# Patient Record
Sex: Male | Born: 1960 | Race: White | Hispanic: No | Marital: Single | Smoking: Former smoker
Health system: Southern US, Community
[De-identification: ages and names within clinical notes are randomized; demographics above are authoritative.]

## PROBLEM LIST (undated history)

## (undated) DIAGNOSIS — D6862 Lupus anticoagulant syndrome: Secondary | ICD-10-CM

## (undated) DIAGNOSIS — Z86718 Personal history of other venous thrombosis and embolism: Secondary | ICD-10-CM

## (undated) DIAGNOSIS — N200 Calculus of kidney: Secondary | ICD-10-CM

## (undated) DIAGNOSIS — G4730 Sleep apnea, unspecified: Secondary | ICD-10-CM

## (undated) DIAGNOSIS — I10 Essential (primary) hypertension: Secondary | ICD-10-CM

## (undated) DIAGNOSIS — J189 Pneumonia, unspecified organism: Secondary | ICD-10-CM

## (undated) DIAGNOSIS — E669 Obesity, unspecified: Secondary | ICD-10-CM

## (undated) DIAGNOSIS — D6851 Activated protein C resistance: Secondary | ICD-10-CM

## (undated) DIAGNOSIS — K439 Ventral hernia without obstruction or gangrene: Secondary | ICD-10-CM

## (undated) HISTORY — DX: Essential (primary) hypertension: I10

## (undated) HISTORY — DX: Activated protein C resistance: D68.51

## (undated) HISTORY — DX: Personal history of other venous thrombosis and embolism: Z86.718

## (undated) HISTORY — DX: Ventral hernia without obstruction or gangrene: K43.9

## (undated) HISTORY — DX: Pneumonia, unspecified organism: J18.9

## (undated) HISTORY — DX: Calculus of kidney: N20.0

## (undated) HISTORY — DX: Lupus anticoagulant syndrome: D68.62

## (undated) HISTORY — DX: Obesity, unspecified: E66.9

## (undated) HISTORY — DX: Sleep apnea, unspecified: G47.30

---
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
## 1983-10-11 HISTORY — PX: FINGER TENDON REPAIR: SHX1640

## 1995-10-11 HISTORY — PX: KIDNEY STONE SURGERY: SHX686

## 2001-03-28 ENCOUNTER — Encounter: Payer: Self-pay | Admitting: Emergency Medicine

## 2001-03-28 ENCOUNTER — Emergency Department (HOSPITAL_COMMUNITY): Admission: EM | Admit: 2001-03-28 | Discharge: 2001-03-28 | Payer: Self-pay | Admitting: Emergency Medicine

## 2001-10-10 HISTORY — PX: OTHER SURGICAL HISTORY: SHX169

## 2001-11-12 ENCOUNTER — Ambulatory Visit (HOSPITAL_COMMUNITY): Admission: RE | Admit: 2001-11-12 | Discharge: 2001-11-12 | Payer: Self-pay

## 2002-10-10 HISTORY — PX: OTHER SURGICAL HISTORY: SHX169

## 2003-10-11 HISTORY — PX: HERNIA REPAIR: SHX51

## 2003-10-11 HISTORY — PX: REVISION TOTAL HIP ARTHROPLASTY: SHX766

## 2004-10-10 HISTORY — PX: OTHER SURGICAL HISTORY: SHX169

## 2015-05-06 ENCOUNTER — Telehealth: Payer: Self-pay | Admitting: Cardiovascular Disease

## 2015-05-06 NOTE — Telephone Encounter (Signed)
Received records from [facility] for appointment on 06/10/15 with Dr Gwenlyn Found.  Records given to [facility] (medical records) for Dr Kennon Holter schedule on 06/10/15. lp

## 2015-06-10 ENCOUNTER — Ambulatory Visit: Payer: Self-pay | Admitting: Cardiovascular Disease

## 2017-11-15 ENCOUNTER — Ambulatory Visit: Payer: BC Managed Care – PPO | Admitting: Gastroenterology

## 2017-11-15 ENCOUNTER — Other Ambulatory Visit: Payer: Self-pay

## 2017-11-20 ENCOUNTER — Encounter: Payer: Self-pay | Admitting: Emergency Medicine

## 2017-11-22 ENCOUNTER — Encounter (INDEPENDENT_AMBULATORY_CARE_PROVIDER_SITE_OTHER): Payer: Self-pay

## 2017-11-22 ENCOUNTER — Encounter: Payer: Self-pay | Admitting: Gastroenterology

## 2017-11-22 ENCOUNTER — Telehealth: Payer: Self-pay | Admitting: Emergency Medicine

## 2017-11-22 ENCOUNTER — Ambulatory Visit: Payer: BC Managed Care – PPO | Admitting: Gastroenterology

## 2017-11-22 VITALS — BP 130/78 | HR 84 | Ht 74.0 in | Wt >= 6400 oz

## 2017-11-22 DIAGNOSIS — Z7901 Long term (current) use of anticoagulants: Secondary | ICD-10-CM | POA: Insufficient documentation

## 2017-11-22 DIAGNOSIS — R195 Other fecal abnormalities: Secondary | ICD-10-CM | POA: Insufficient documentation

## 2017-11-22 MED ORDER — NA SULFATE-K SULFATE-MG SULF 17.5-3.13-1.6 GM/177ML PO SOLN: 1.0000 | ORAL | 0 refills | Status: DC

## 2017-11-22 NOTE — Progress Notes (Signed)
11/22/2017 Cody Turner [id number] [birth year]   HISTORY OF PRESENT ILLNESS:  This is a pleasant 57 year old male who is new to our office.  He was referred here by his PCP, Dr. Helene Kelp, for evaluation regarding a positive cologuard stool study.  We do not have the results but are trying to get them from his PCP.  He has never had a colonoscopy in the past.  He denies seeing blood in his stools or black stools.  He is on coumadin due to history of blood clots caused by Leiden Factor 5 and lupus anticoagulant syndrome.  He has been on this since 2003 and has held it for other things in the past with lovenox in its place.  Hgb is normal.  His BMI is over 50.   Denies any GI complaints.  He is currently getting over shingles so has been in a lot of pain from that.   Past Medical History:  Diagnosis Date  . Hx of blood clots   . Hypertension   . Kidney stones   . Obesity   . Pneumonia   . Sleep apnea   . Ventral hernia    Past Surgical History:  Procedure Laterality Date  . APPENDECTOMY  1975  . FINGER TENDON REPAIR Left 1985  . hematoma removal Right 2006   shin  . HERNIA REPAIR  2005  . ingrown toenail removal  Bilateral 2004  . [place]  . REVISION TOTAL HIP ARTHROPLASTY Left 2005  . sinus biopsy  2003  . staph infection removal  2003   at pin site   . traction rod removal.  2003    reports that he has quit smoking. he has never used smokeless tobacco. He reports that he drinks alcohol. He reports that he does not use drugs. family history includes Breast cancer in his mother; Heart disease in his maternal grandfather, maternal grandmother, and paternal grandfather; Other in his father. Allergies  Allergen Reactions  . Augmentin [Amoxicillin-Pot Clavulanate] Rash      Outpatient Encounter Medications as of 11/22/2017  Medication Sig  . Ascorbic Acid (VITA-C PO) Take 1 capsule by mouth daily.  . clindamycin (CLEOCIN) 150 MG capsule Take 300 mg by  mouth 3 (three) times daily.  Marland Kitchen gabapentin (NEURONTIN) 300 MG capsule Take 300 mg by mouth 3 (three) times daily.   . hydrochlorothiazide (HYDRODIURIL) 25 MG tablet Take 25 mg by mouth daily.   Marland Kitchen losartan (COZAAR) 100 MG tablet Take 100 mg by mouth daily.   . metoprolol succinate (TOPROL-XL) 100 MG 24 hr tablet Take 100 mg by mouth daily.   . MULTIPLE VITAMIN PO Take 1 capsule by mouth daily.  . traMADol (ULTRAM) 50 MG tablet Take 50 mg by mouth 3 (three) times daily.   Marland Kitchen warfarin (COUMADIN) 1 MG tablet Takes 7 mg mon and Friday and 10mg  other days  . [DISCONTINUED] clindamycin (CLEOCIN) 300 MG capsule   . [DISCONTINUED] valACYclovir (VALTREX) 1000 MG tablet    No facility-administered encounter medications on file as of 11/22/2017.      REVIEW OF SYSTEMS  : All other systems reviewed and negative except where noted in the History of Present Illness.   PHYSICAL EXAM: BP 130/78 (Cuff Size: Large)   Pulse 84   Ht 6\' 2"  (1.88 m)   Wt (!) 447 lb (202.8 kg)   BMI 57.39 kg/m  General: Well developed white male in no acute distress Head: Normocephalic and  atraumatic Eyes:  Sclerae anicteric, conjunctiva pink. Ears: Normal auditory acuity Lungs: Clear throughout to auscultation; no increased WOB. Heart: Regular rate and rhythm; no M/R/G. Abdomen: Soft, obese, non-distended.  BS present. Non-tender. Rectal:  Will be done at the time of colonoscopy. Musculoskeletal: Symmetrical with no gross deformities  Skin: No lesions on visible extremities Extremities: No edema  Neurological: Alert oriented x 4, grossly non-focal Psychological:  Alert and cooperative. Normal mood and affect  ASSESSMENT AND PLAN: *Positive cologuard/abnormal contents in stool:  Needs colonoscopy.  Will schedule with Dr. Loletha Carrow at [facility] hospital due to BMI over 50. *Leiden Factor 5 and lupus anticoagulant syndrome, requiring anticoagulation with coumadin:  Will hold coumadin for 5 days prior to endoscopic procedures -  will instruct when and how to resume after procedure. Benefits and risks of procedure explained including risks of bleeding, perforation, infection, missed lesions, reactions to medications and possible need for hospitalization and surgery for complications. Additional rare but real risk of stroke or other vascular clotting events off of coumadin also explained and need to seek urgent help if any signs of these problems occur. Will communicate by phone or EMR with patient's  prescribing provider, Dr. Helene Kelp, to confirm that holding coumadin is reasonable in this case.  Will likely need Lovenox, which will defer to his PCP.   CC:  Ronita Hipps, MD

## 2017-11-22 NOTE — Telephone Encounter (Signed)
[name] 1961-09-18 [id number]  Dear Dr. Helene Kelp:  We have scheduled the above named patient for a colonoscopy procedure. Our records show that he is on anticoagulation therapy.  Please advise as to whether the patient may come off their therapy of Coumadin 7 days prior to their procedure which is scheduled for 01-16-18 and if he will need Lovenox bridging.  Please route your response to Tinnie Gens, CMA or fax response to [phone].  Sincerely,    [facility]

## 2017-11-22 NOTE — Patient Instructions (Signed)

## 2017-11-23 NOTE — Progress Notes (Signed)
Thank you for sending this case to me. I have reviewed the entire note, and the outlined plan seems appropriate.  Agree with colonoscopy at hospital endoscopy lab due to BMI.  My next available is April, but it appears this can wait until then.  Must have assistance from his primary care provider re: bridging lovenox therapy if needed.  Wilfrid Lund, MD

## 2017-11-27 NOTE — Telephone Encounter (Signed)
Received fax from Dr. Greggory Keen office ok to hold Coumadin 7 days prior to patients appt. Does need to be bridged the entire time and 2 days after starting Coumadin back. He had an appointment on 11-24-17 and was informed of this at his appointment. Will send fax to be scanned.

## 2018-01-08 ENCOUNTER — Ambulatory Visit: Payer: BC Managed Care – PPO | Admitting: Internal Medicine

## 2018-01-08 ENCOUNTER — Ambulatory Visit (INDEPENDENT_AMBULATORY_CARE_PROVIDER_SITE_OTHER)
Admission: RE | Admit: 2018-01-08 | Discharge: 2018-01-08 | Disposition: A | Discharge status: Home / Self Care | Payer: BC Managed Care – PPO | Source: Ambulatory Visit | Attending: Internal Medicine | Admitting: Internal Medicine

## 2018-01-08 ENCOUNTER — Encounter: Payer: Self-pay | Admitting: Internal Medicine

## 2018-01-08 VITALS — BP 122/80 | HR 71 | Ht 74.0 in | Wt >= 6400 oz

## 2018-01-08 DIAGNOSIS — R05 Cough: Secondary | ICD-10-CM | POA: Diagnosis not present

## 2018-01-08 DIAGNOSIS — R1012 Left upper quadrant pain: Secondary | ICD-10-CM

## 2018-01-08 DIAGNOSIS — R058 Other specified cough: Secondary | ICD-10-CM

## 2018-01-08 MED ORDER — PREDNISONE 10 MG PO TABS: ORAL_TABLET | ORAL | 0 refills | Status: DC

## 2018-01-08 MED ORDER — ACETAMINOPHEN-CODEINE #3 300-30 MG PO TABS: 1.0000 | ORAL_TABLET | ORAL | 0 refills | Status: AC | PRN
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
## 2018-01-08 NOTE — Patient Instructions (Addendum)
Take mucinex dm 1200 every 12 hours and supplement if needed with tylenol #3  up to 1-2 every 4 hours to suppress the urge to cough. Swallowing water and/or using ice chips/non mint and menthol containing candies (such as lifesavers or sugarless jolly ranchers) are also effective.  You should rest your voice and avoid activities that you know make you cough.  Once you have eliminated the cough for 3 straight days try reducing the tylenol #3 first,  then the mucinex dm  as tolerated.   Prednisone 10 mg take  4 each am x 2 days,   2 each am x 2 days,  1 each am x 2 days and stop    Try prilosec otc 20mg   Take 30-60 min before first meal of the day and Pepcid ac - also otc- (famotidine) 20 mg one @  bedtime until cough is completely gone for at least a week without the need for cough suppression     GERD (REFLUX)  is an extremely common cause of respiratory symptoms just like yours , many times with no obvious heartburn at all.    It can be treated with medication, but also with lifestyle changes including elevation of the head of your bed (ideally with 6 inch  bed blocks),  Smoking cessation, avoidance of late meals, excessive alcohol, and avoid fatty foods, chocolate, peppermint, colas, red wine, and acidic juices such as orange juice.  NO MINT OR MENTHOL PRODUCTS SO NO COUGH DROPS   USE SUGARLESS CANDY INSTEAD (Jolley ranchers or Stover's or Life Savers) or even ice chips will also do - the key is to swallow to prevent all throat clearing. NO OIL BASED VITAMINS - use powdered substitutes.    Please remember to go to the  x-ray department downstairs in the basement  for your tests - we will call you with the results when they are available.      If not better in a week please return to clinic

## 2018-01-09 ENCOUNTER — Telehealth: Payer: Self-pay | Admitting: *Deleted

## 2018-01-09 ENCOUNTER — Telehealth: Payer: Self-pay | Admitting: Gastroenterology

## 2018-01-09 ENCOUNTER — Encounter: Payer: Self-pay | Admitting: Internal Medicine

## 2018-01-09 DIAGNOSIS — R1012 Left upper quadrant pain: Secondary | ICD-10-CM | POA: Insufficient documentation

## 2018-01-09 NOTE — Telephone Encounter (Signed)
Pt is calling back [phone]

## 2018-01-09 NOTE — Telephone Encounter (Signed)
Please advise 

## 2018-01-09 NOTE — Telephone Encounter (Signed)
LMTCB

## 2018-01-09 NOTE — Assessment & Plan Note (Signed)
Body mass index is 56.13 kg/m.  -  trending donw No results found for: TSH   Contributing to gerd risk/ doe/needs  to achieve and maintain neg calorie balance long term > defer f/u primary care including intermittently monitoring thyroid status

## 2018-01-09 NOTE — Telephone Encounter (Signed)
Called and spoke with patient, he states that he is feeling much better and has no skin discoloration or bruising. Routing this to MW for FYI.

## 2018-01-09 NOTE — Progress Notes (Signed)
LMTCB

## 2018-01-09 NOTE — Telephone Encounter (Signed)
-----   Message from Tanda Rockers, MD sent at 01/09/2018  6:15 AM EDT ----- Call and see if better and also ask him to look for any signs of bruising /skin discoloration in the area he's hurting and if so may need to hold coumadin, check inr/ get CT abd to look for hematoma

## 2018-01-09 NOTE — Assessment & Plan Note (Addendum)
Of the three most common causes of  Sub-acute or recurrent or chronic cough, only one (GERD)  can actually contribute to/ trigger  the other two (asthma and post nasal drip syndrome)  and perpetuate the cylce of cough.  While not intuitively obvious, many patients with chronic low grade reflux do not cough until there is a primary insult that disturbs the protective epithelial barrier and exposes sensitive nerve endings.   This is typically viral as likely was the case here.     The point is that once this occurs, it is difficult to eliminate the cycle  using anything but a maximally effective acid suppression regimen at least in the short run, accompanied by an appropriate diet to address non acid GERD and control / eliminate the cough itself for at least 3 days with tylenol #3 and if not better sinus CT next step   Total time devoted to counseling  > 50 % of initial 60 min office visit:  review case with pt/ discussion of options/alternatives/ personally creating written customized instructions  in presence of pt  then going over those specific  Instructions directly with the pt including how to use all of the meds but in particular covering each new medication in detail and the difference between the maintenance= "automatic" meds and the prns using an action plan format for the latter (If this problem/symptom => do that organization reading Left to right).  Please see AVS from this visit for a full list of these instructions which I personally wrote for this pt and  are unique to this visit.

## 2018-01-10 NOTE — Telephone Encounter (Signed)
Thank you for telling me. It must be moved to my next available [facility] outpatient date, and he will need new instructions for bowel preparation and anticoagulation.  Please hold that slot for next week because I may need it for someone else.  - HD

## 2018-01-12 ENCOUNTER — Other Ambulatory Visit: Payer: Self-pay

## 2018-01-12 NOTE — Telephone Encounter (Signed)
Patient advised of rescheduled date, mailed new instructions to him. Mailed patient reminder on holding coumadin 7 days prior to procedure, he verbalized understanding of instructions on doing the lovenox bridge. He states he received instructions from his PCP on the lovenox.

## 2018-02-05 ENCOUNTER — Telehealth: Payer: Self-pay | Admitting: Internal Medicine

## 2018-02-05 ENCOUNTER — Telehealth: Payer: Self-pay

## 2018-02-05 ENCOUNTER — Ambulatory Visit: Payer: BC Managed Care – PPO | Admitting: Internal Medicine

## 2018-02-05 VITALS — BP 136/80 | HR 79 | Ht 74.0 in | Wt >= 6400 oz

## 2018-02-05 DIAGNOSIS — R05 Cough: Secondary | ICD-10-CM

## 2018-02-05 DIAGNOSIS — R1012 Left upper quadrant pain: Secondary | ICD-10-CM | POA: Diagnosis not present

## 2018-02-05 DIAGNOSIS — I10 Essential (primary) hypertension: Secondary | ICD-10-CM | POA: Insufficient documentation

## 2018-02-05 DIAGNOSIS — R058 Other specified cough: Secondary | ICD-10-CM

## 2018-02-05 MED ORDER — BUDESONIDE-FORMOTEROL FUMARATE 80-4.5 MCG/ACT IN AERO: INHALATION_SPRAY | RESPIRATORY_TRACT | 11 refills | Status: DC

## 2018-02-05 MED ORDER — ACETAMINOPHEN-CODEINE #3 300-30 MG PO TABS: 1.0000 | ORAL_TABLET | ORAL | 0 refills | Status: AC | PRN

## 2018-02-05 MED ORDER — PANTOPRAZOLE SODIUM 40 MG PO TBEC: DELAYED_RELEASE_TABLET | ORAL | 2 refills | Status: DC
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
## 2018-02-05 NOTE — Assessment & Plan Note (Signed)
Of the three most common causes of  Sub-acute / recurrent or chronic cough, only one (GERD)  can actually contribute to/ trigger  the other two (asthma and post nasal drip syndrome)  and perpetuate the cylce of cough.  While not intuitively obvious, many patients with chronic low grade reflux do not cough until there is a primary insult that disturbs the protective epithelial barrier and exposes sensitive nerve endings.   This is typically viral but can due to PNDS from chronic rhintis/sinusitis  And  may apply here.    The point is that once this occurs, it is difficult to eliminate the cycle  using anything but a maximally effective acid suppression regimen at least in the short run, accompanied by an appropriate diet to address non acid GERD and control / eliminate the cough itself for at least 3 days with codeine.   rec max acid suppression with pppi bid ac and h2 hs and refer back to ENT for probable chronic sinusitis/ pnds   I had an extended discussion with the patient reviewing all relevant studies completed to date and  lasting 15 to 20 minutes of a 25 minute acute office visit    Each maintenance medication was reviewed in detail including most importantly the difference between maintenance and prns and under what circumstances the prns are to be triggered using an action plan format that is not reflected in the computer generated alphabetically organized AVS.    Please see AVS for specific instructions unique to this visit that I personally wrote and verbalized to the the pt in detail and then reviewed with pt  by my nurse highlighting any  changes in therapy recommended at today's visit to their plan of care.

## 2018-02-05 NOTE — Assessment & Plan Note (Signed)
Body mass index is 57.06 kg/m.  -  trending up  No results found for: TSH   Contributing to gerd risk/ doe/reviewed the need and the process to achieve and maintain neg calorie balance > defer f/u primary care including intermittently monitoring thyroid status

## 2018-02-05 NOTE — Telephone Encounter (Signed)
Called and spoke to pt. Informed him of the recs per MW. Appt made with MW at 430 today. Pt aware to bring all meds with him. Nothing further needed at this time.

## 2018-02-05 NOTE — Patient Instructions (Addendum)
Take mucinex dm 1200 every 12 hours and supplement if needed with tylenol #3  up to 1-2 every 4 hours to suppress the urge to cough. Swallowing water and/or using ice chips/non mint and menthol containing candies (such as lifesavers or sugarless jolly ranchers) are also effective.  You should rest your voice and avoid activities that you know make you cough.  Once you have eliminated the cough for 3 straight days try reducing the tylenol #3 first,  then the mucinex dm  as tolerated.    Start Protonix 40 mg Take 30- 60 min before your first and last meals of the day   And Pepcid ac - also otc- (famotidine) 20 mg one @  bedtime until you return here   Let Dr Meredith Leeds know I think he should proceed with further sinus evaluation based on your nasal exam today which looks suggests active infection   For breakthu  Heartburn you can use gaviscon liquid as needed  Please schedule a follow up office visit in 2 weeks, sooner if needed  with all medications /inhalers/ solutions in hand so we can verify exactly what you are taking. This includes all medications from all doctors and over the counters

## 2018-02-05 NOTE — Assessment & Plan Note (Signed)
Concerned about high dose BB use here an ideally In the setting of respiratory symptoms of unknown etiology,  It would be preferable to use bystolic, the most beta -1  selective Beta blocker available in sample form, with bisoprolol the most selective generic choice  on the market, at least on a trial basis, to make sure the spillover Beta 2 effects of the less specific Beta blockers are not contributing to this patient's symptoms.   For now will ask him to just change the lopressor to 100 mg one half bid to lower potential of peak levels to contribute to airway instability/ cough
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
## 2018-02-05 NOTE — Telephone Encounter (Signed)
Called and spoke with patients mother. She stated that she was calling for the patient since he was not feeling well.   Called and spoke with patient, he states that he is still having congestion, deep coughing, and increased shortness of breath. Patient states that he has a colonoscopy on 5.6.19 and would like this cleared up before then.   Patient saw MW on 4.1.19 here are the last OV instructions. MW please advise. Thanks.   ------------------------------- Take mucinex dm 1200 every 12 hours and supplement if needed with tylenol #3  up to 1-2 every 4 hours to suppress the urge to cough. Swallowing water and/or using ice chips/non mint and menthol containing candies (such as lifesavers or sugarless jolly ranchers) are also effective.  You should rest your voice and avoid activities that you know make you cough.  Once you have eliminated the cough for 3 straight days try reducing the tylenol #3 first,  then the mucinex dm  as tolerated.   Prednisone 10 mg take  4 each am x 2 days,   2 each am x 2 days,  1 each am x 2 days and stop    Try prilosec otc 20mg   Take 30-60 min before first meal of the day and Pepcid ac - also otc- (famotidine) 20 mg one @  bedtime until cough is completely gone for at least a week without the need for cough suppression     GERD (REFLUX)  is an extremely common cause of respiratory symptoms just like yours , many times with no obvious heartburn at all.    It can be treated with medication, but also with lifestyle changes including elevation of the head of your bed (ideally with 6 inch  bed blocks),  Smoking cessation, avoidance of late meals, excessive alcohol, and avoid fatty foods, chocolate, peppermint, colas, red wine, and acidic juices such as orange juice.  NO MINT OR MENTHOL PRODUCTS SO NO COUGH DROPS   USE SUGARLESS CANDY INSTEAD (Jolley ranchers or Stover's or Life Savers) or even ice chips will also do - the key is to swallow to prevent all throat  clearing. NO OIL BASED VITAMINS - use powdered substitutes.    Please remember to go to the  x-ray department downstairs in the basement  for your tests - we will call you with the results when they are available.      If not better in a week please return to clinic

## 2018-02-05 NOTE — Telephone Encounter (Signed)
Ov with all meds in hand ok to add on at end of days

## 2018-02-05 NOTE — Assessment & Plan Note (Signed)
Onset 01/07/18 > rx Tylenol #3 and   ask him to look for any signs of bruising /skin discoloration in the area he's hurting and if so may need to hold coumadin, check inr/ get CT abd to look for hematoma > resolved 02/05/2018 despite continued cough

## 2018-02-06 ENCOUNTER — Encounter: Payer: Self-pay | Admitting: Cardiology

## 2018-02-07 ENCOUNTER — Ambulatory Visit: Payer: BC Managed Care – PPO | Admitting: Cardiovascular Disease

## 2018-02-07 ENCOUNTER — Encounter: Payer: Self-pay | Admitting: Cardiovascular Disease

## 2018-02-07 VITALS — BP 126/88 | HR 78 | Ht 74.0 in | Wt >= 6400 oz

## 2018-02-07 DIAGNOSIS — R0609 Other forms of dyspnea: Secondary | ICD-10-CM | POA: Diagnosis not present

## 2018-02-07 NOTE — Patient Instructions (Addendum)
  Office: [phone]  /  Fax: [phone]  Medication Instructions:  Your physician recommends that you continue on your current medications as directed. Please refer to the Current Medication list given to you today.   Labwork: None Ordered   Testing/Procedures: None Ordered   Follow-Up: Your physician recommends that you schedule a follow-up appointment in: as needed with  Dr. Acie Fredrickson   If you need a refill on your cardiac medications before your next appointment, please call your pharmacy.   Thank you for choosing [facility]! Christen Bame, RN [phone]

## 2018-02-07 NOTE — Progress Notes (Signed)
Cardiology Office Note:    Date:  02/07/2018   ID:  Cody Turner, DOB 05-26-1961, MRN [id number]  PCP:  Ronita Hipps, MD  Cardiologist:  Cardiology   Referring MD: Ronita Hipps, MD   Chief Complaint  Patient presents with  . Shortness of Breath     Feb 07, 2018    Cody Turner is a 57 y.o. male with a hx of  morbid obesity, obstructive sleep apnea , hypertension we were asked to see today by Dr. Melvyn Novas  for further evaluation of chest pain and shortness of breath.  He is on chronic Coumadin therapy because of a hypercoagulable state.    . Lupus anticoagulant syndrome (HCC)   Has had chronic shortness of breath Had some chest pain - related to a shingles infection Had some indigestion , chest burning  Better with prilosec Saw pulmonary the next day who agreed that the issues was likely GERD   Scheduled for colonoscopy next week.  He is planning on having bariatric surgery later this summer.  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    Walks in the pool.   No CP  With exercise  has significant DOE  Has OSA - is on BIPAP   Has a hypercoagulable state - Factor V Leiden and Lupus anticoagulant are both listed.   He does not know which he has.   Past Medical History:  Diagnosis Date  . Factor V Leiden ([place])   . Hx of blood clots   . Hypertension   . Kidney stones   . Lupus anticoagulant syndrome ([place])   . Obesity   . Pneumonia   . Sleep apnea   . Ventral hernia     Past Surgical History:  Procedure Laterality Date  . APPENDECTOMY  1975  . FINGER TENDON REPAIR Left 1985  . hematoma removal Right 2006   shin  . HERNIA REPAIR  2005  . ingrown toenail removal  Bilateral 2004  . [place]  . REVISION TOTAL HIP ARTHROPLASTY Left 2005  . sinus biopsy  2003  . staph infection removal  2003   at pin site   . traction rod removal.  2003    Current Medications: Current Meds  Medication Sig    . acetaminophen-codeine (TYLENOL #3) 300-30 MG tablet Take 1-2 tablets by mouth every 4 (four) hours as needed for up to 5 days (cough).  . Ascorbic Acid (VITA-C PO) Take 1 capsule by mouth daily.  Marland Kitchen aspirin 81 MG EC tablet Take 81 mg by mouth daily. Swallow whole.  . calcium-vitamin D 250-100 MG-UNIT tablet Take 1 tablet by mouth 2 (two) times daily.  . [place] Calcium 500 MG CAPS Take 1 capsule by mouth daily.  Marland Kitchen DOXYCYCLINE PO Take 100 mg by mouth 2 (two) times daily.  . famotidine (PEPCID) 20 MG tablet Take 20 mg by mouth at bedtime.  . hydrochlorothiazide (HYDRODIURIL) 25 MG tablet Take 25 mg by mouth daily.   Marland Kitchen losartan (COZAAR) 100 MG tablet Take 100 mg by mouth daily.   . metoprolol succinate (TOPROL-XL) 100 MG 24 hr tablet Take 100 mg by mouth daily.   . MULTIPLE VITAMIN PO Take 1 capsule by mouth daily.  . Omega-3 Fatty Acids (FISH OIL) 1000 MG CAPS Take 1 capsule by mouth daily.  . pantoprazole (PROTONIX) 40 MG tablet Take 30- 60 min before your first and last meals of  the day  . UNABLE TO FIND Med Name: BIPAP  . warfarin (COUMADIN) 1 MG tablet Takes 7 mg mon and Friday and 10mg  other days  . Zinc 100 MG TABS Take by mouth daily.     Allergies:   Augmentin [amoxicillin-pot clavulanate]   Social History   Socioeconomic History  . Marital status: Single    Spouse name: Not on file  . Number of children: 0  . Years of education: Not on file  . Highest education level: Not on file  Occupational History  . Occupation: Health and safety inspector    Comment: disabled  Social Needs  . Financial resource strain: Not on file  . Food insecurity:    Worry: Not on file    Inability: Not on file  . Transportation needs:    Medical: Not on file    Non-medical: Not on file  Tobacco Use  . Smoking status: Former Smoker    Packs/day: 2.00    Years: 5.00    Pack years: 10.00    Types: Cigarettes    Last attempt to quit: 10/10/2001    Years since quitting: 16.3  . Smokeless tobacco: Never Used   Substance and Sexual Activity  . Alcohol use: Yes    Frequency: Never    Comment: occ beer  . Drug use: No  . Sexual activity: Not on file  Lifestyle  . Physical activity:    Days per week: Not on file    Minutes per session: Not on file  . Stress: Not on file  Relationships  . Social connections:    Talks on phone: Not on file    Gets together: Not on file    Attends religious service: Not on file    Active member of club or organization: Not on file    Attends meetings of clubs or organizations: Not on file    Relationship status: Not on file  Other Topics Concern  . Not on file  Social History Narrative  . Not on file     Family History: The patient's family history includes Breast cancer in his mother; Heart disease in his maternal grandfather, maternal grandmother, and paternal grandfather; Other in his father. There is no history of Colon cancer, Esophageal cancer, or Rectal cancer.  ROS:   Please see the history of present illness.     All other systems reviewed and are negative.  EKGs/Labs/Other Studies Reviewed:    The following studies were reviewed today:   EKG:    Feb 07, 2018:   NSR at 29. Normal ECG   Recent Labs: No results found for requested labs within last 8760 hours.  Recent Lipid Panel No results found for: CHOL, TRIG, HDL, CHOLHDL, VLDL, LDLCALC, LDLDIRECT  Physical Exam:    VS:  BP 126/88   Pulse 78   Ht 6\' 2"  (1.88 m)   Wt (!) 444 lb 12.8 oz (201.8 kg)   SpO2 93%   BMI 57.11 kg/m     Wt Readings from Last 3 Encounters:  02/07/18 (!) 444 lb 12.8 oz (201.8 kg)  02/05/18 (!) 444 lb 6.4 oz (201.6 kg)  01/08/18 (!) 437 lb 3.2 oz (198.3 kg)     GEN: morbidly obese male HEENT: Normal NECK: No JVD; No carotid bruits LYMPHATICS: No lymphadenopathy CARDIAC: RRR, no murmurs, rubs, gallops RESPIRATORY:  Clear to auscultation without rales, wheezing or rhonchi  ABDOMEN: Soft, non-tender, non-distended MUSCULOSKELETAL:  No edema; No  deformity  SKIN: Warm and dry NEUROLOGIC:  Alert and oriented x 3 PSYCHIATRIC:  Normal affect   ASSESSMENT:    1. DOE (dyspnea on exertion)   2. Morbid obesity ([place])    PLAN:    In order of problems listed above:  1. Chest discomfort: The patient had some chest discomfort several months ago.  It turns out that this was related to a shingles infection.  Is not had any recurrent episodes of chest pain since that time.  Also had some indigestion.  The symptoms resolved with Prilosec.  Shortness of breath with exertion: I suspect that his shortness of breath is due to morbid obesity.  He has pulmonary hypertension.  He is seeing pulmonary medicine.  Of given him contact information for  Office: [phone]  /  Fax: [phone]      we have ash him to have his medical doctor make the referral    No active cardiac issues ,   Return as needed.    Medication Adjustments/Labs and Tests Ordered: Current medicines are reviewed at length with the patient today.  Concerns regarding medicines are outlined above.  Orders Placed This Encounter  Procedures  . EKG 12-Lead   No orders of the defined types were placed in this encounter.   Patient Instructions   Office: [phone]  /  Fax: [phone]  Medication Instructions:  Your physician recommends that you continue on your current medications as directed. Please refer to the Current Medication list given to you today.   Labwork: None Ordered   Testing/Procedures: None Ordered   Follow-Up: Your physician recommends that you schedule a follow-up appointment in: as needed with  Dr. Acie Fredrickson   If you need a refill on your cardiac medications before your next appointment, please call your pharmacy.   Thank you for choosing [facility]! Christen Bame, RN [phone]       Signed, Mertie Moores, MD  02/07/2018 5:36 PM    [place]

## 2018-02-08 ENCOUNTER — Telehealth: Payer: Self-pay | Admitting: Gastroenterology

## 2018-02-08 NOTE — Telephone Encounter (Signed)
Thank you for checking.  He is OK to have procedure as schedule on 02/12/18  Last dose of coumadin 5/1 is still 5 days before procedure date.

## 2018-02-08 NOTE — Telephone Encounter (Signed)
Patient advised ok to proceed as scheduled for colonoscopy on 02/12/18.

## 2018-02-08 NOTE — Telephone Encounter (Signed)
Spoke to patient, he had looked at the wrong packet information about his coumadin. Had also verbally went over instructions with him on phone on 01/09/18. He mistakenly took his last dose of coumadin on 02/07/18, did have a lovenox injection today. He did call his PCP's office who had advised to stop the coumadin 5 days prior with lovenox BID. He had labs done Tuesday, based on that PCP felt he would be okay. Please advise if patient needs to be rescheduled. He was rescheduled from April due to congestion and Dr. Gustavus Bryant recommendations. Patient is scheduled at hospital for 02/12/18.

## 2018-02-09 ENCOUNTER — Encounter (HOSPITAL_COMMUNITY): Payer: Self-pay

## 2018-02-09 ENCOUNTER — Telehealth: Payer: Self-pay

## 2018-02-09 ENCOUNTER — Other Ambulatory Visit: Payer: Self-pay

## 2018-02-09 NOTE — Telephone Encounter (Signed)
Patient wanted to know when he should take his last dose of lovenox. I let him know that he needs to contact his PCP office immediately to speak to them about this as they are the ones that prescribed it and gave him the directions. He will call them now.

## 2018-02-12 ENCOUNTER — Telehealth: Payer: Self-pay | Admitting: Gastroenterology

## 2018-02-12 ENCOUNTER — Encounter (HOSPITAL_COMMUNITY): Payer: Self-pay

## 2018-02-12 ENCOUNTER — Encounter (HOSPITAL_COMMUNITY)
Admission: RE | Disposition: A | Discharge status: Home / Self Care | Payer: Self-pay | Source: Ambulatory Visit | Attending: Gastroenterology

## 2018-02-12 ENCOUNTER — Other Ambulatory Visit: Payer: Self-pay

## 2018-02-12 ENCOUNTER — Ambulatory Visit (HOSPITAL_COMMUNITY): Payer: BC Managed Care – PPO | Admitting: Anesthesiology

## 2018-02-12 ENCOUNTER — Ambulatory Visit (HOSPITAL_COMMUNITY)
Admission: RE | Admit: 2018-02-12 | Discharge: 2018-02-12 | Disposition: A | Discharge status: Home / Self Care | Payer: BC Managed Care – PPO | Source: Ambulatory Visit | Attending: Gastroenterology | Admitting: Gastroenterology

## 2018-02-12 DIAGNOSIS — K621 Rectal polyp: Secondary | ICD-10-CM | POA: Insufficient documentation

## 2018-02-12 DIAGNOSIS — Q438 Other specified congenital malformations of intestine: Secondary | ICD-10-CM | POA: Diagnosis not present

## 2018-02-12 DIAGNOSIS — Z7901 Long term (current) use of anticoagulants: Secondary | ICD-10-CM

## 2018-02-12 DIAGNOSIS — Z6841 Body Mass Index (BMI) 40.0 and over, adult: Secondary | ICD-10-CM | POA: Diagnosis not present

## 2018-02-12 DIAGNOSIS — I10 Essential (primary) hypertension: Secondary | ICD-10-CM | POA: Diagnosis not present

## 2018-02-12 DIAGNOSIS — K635 Polyp of colon: Secondary | ICD-10-CM | POA: Insufficient documentation

## 2018-02-12 DIAGNOSIS — R195 Other fecal abnormalities: Secondary | ICD-10-CM

## 2018-02-12 DIAGNOSIS — G4730 Sleep apnea, unspecified: Secondary | ICD-10-CM | POA: Insufficient documentation

## 2018-02-12 DIAGNOSIS — Z87891 Personal history of nicotine dependence: Secondary | ICD-10-CM | POA: Insufficient documentation

## 2018-02-12 DIAGNOSIS — Z538 Procedure and treatment not carried out for other reasons: Secondary | ICD-10-CM

## 2018-02-12 DIAGNOSIS — Z79899 Other long term (current) drug therapy: Secondary | ICD-10-CM | POA: Insufficient documentation

## 2018-02-12 DIAGNOSIS — D122 Benign neoplasm of ascending colon: Secondary | ICD-10-CM

## 2018-02-12 DIAGNOSIS — D6851 Activated protein C resistance: Secondary | ICD-10-CM | POA: Diagnosis not present

## 2018-02-12 HISTORY — PX: COLONOSCOPY WITH PROPOFOL: SHX5780

## 2018-02-12 SURGERY — COLONOSCOPY WITH PROPOFOL
Anesthesia: Monitor Anesthesia Care

## 2018-02-12 MED ORDER — PROPOFOL 10 MG/ML IV BOLUS
INTRAVENOUS | Status: AC
  Filled 2018-02-12: qty 20

## 2018-02-12 MED ORDER — PROPOFOL 500 MG/50ML IV EMUL
INTRAVENOUS | Status: DC | PRN
  Administered 2018-02-12: 200 ug/kg/min via INTRAVENOUS

## 2018-02-12 MED ORDER — SODIUM CHLORIDE 0.9 % IV SOLN: INTRAVENOUS | Status: DC

## 2018-02-12 MED ORDER — LIDOCAINE 2% (20 MG/ML) 5 ML SYRINGE
INTRAMUSCULAR | Status: DC | PRN
  Administered 2018-02-12: 40 mg via INTRAVENOUS

## 2018-02-12 MED ORDER — PROPOFOL 10 MG/ML IV BOLUS
INTRAVENOUS | Status: DC | PRN
  Administered 2018-02-12: 60 mg via INTRAVENOUS

## 2018-02-12 MED ORDER — LACTATED RINGERS IV SOLN
INTRAVENOUS | Status: DC
  Administered 2018-02-12: 11:00:00 via INTRAVENOUS

## 2018-02-12 MED ORDER — ONDANSETRON HCL 4 MG/2ML IJ SOLN
INTRAMUSCULAR | Status: DC | PRN
  Administered 2018-02-12: 4 mg via INTRAVENOUS

## 2018-02-12 SURGICAL SUPPLY — 21 items

## 2018-02-12 NOTE — H&P (Signed)
History:  This patient presents for endoscopic testing for positive cologuard test.  Cody Turner Referring physician: Ronita Hipps, MD  Past Medical History: Past Medical History:  Diagnosis Date  . Factor V Leiden ([place])   . Hx of blood clots   . Hypertension   . Kidney stones   . Lupus anticoagulant syndrome ([place])   . Obesity   . Pneumonia   . Sleep apnea    wear bipap  . Ventral hernia      Past Surgical History: Past Surgical History:  Procedure Laterality Date  . APPENDECTOMY  1975  . FINGER TENDON REPAIR Left 1985  . hematoma removal Right 2006   shin  . HERNIA REPAIR  2005  . ingrown toenail removal  Bilateral 2004  . [place]  . REVISION TOTAL HIP ARTHROPLASTY Left 2005  . sinus biopsy  2003  . staph infection removal  2003   at pin site   . traction rod removal.  2003    Allergies: Allergies  Allergen Reactions  . Augmentin [Amoxicillin-Pot Clavulanate] Rash    Outpatient Meds: Current Facility-Administered Medications  Medication Dose Route Frequency Provider Last Rate Last Dose  . 0.9 %  sodium chloride infusion   Intravenous Continuous Cody Turner, Cody D, PA-C          ___________________________________________________________________ Objective   Exam:  BP (!) 155/63   Pulse 63   Temp 97.9 F (36.6 C) (Oral)   Resp 15   Ht 6\' 2"  (1.88 m)   Wt (!) 444 lb (201.4 kg)   SpO2 96%   BMI 57.01 kg/m    CV: RRR without murmur, S1/S2, no JVD, no peripheral edema  Resp: clear to auscultation bilaterally, normal RR and effort noted  GI: morbidly obese, limiting exam, soft, no tenderness, with active bowel sounds. No guarding or palpable organomegaly noted.  Neuro: awake, alert and oriented x 3. Normal gross motor function and fluent speech   Assessment:  Positive cologuard  Through a miscommunication, this patient was given Lovenox this morning.  We discussed the options of proceeding today and potentially  needing a repeat colonoscopy if polypectomy needed vs canceling procedure today and rescheduling to another time. He decided to proceed today. His last coumadin dose was 5/1  Plan:  Colonoscopy today. Instructions for anticoagulation will be given after procedure.   Cody Turner

## 2018-02-12 NOTE — Anesthesia Preprocedure Evaluation (Addendum)
Anesthesia Evaluation  Patient identified by MRN, date of birth, ID band Patient awake    Reviewed: Allergy & Precautions, NPO status , Patient's Chart, lab work & pertinent test results  Airway Mallampati: III  TM Distance: >3 FB Neck ROM: Full    Dental  (+) Teeth Intact, Dental Advisory Given   Pulmonary former smoker,    breath sounds clear to auscultation       Cardiovascular hypertension,  Rhythm:Regular Rate:Normal     Neuro/Psych    GI/Hepatic   Endo/Other    Renal/GU      Musculoskeletal   Abdominal (+) + obese,   Peds  Hematology   Anesthesia Other Findings   Reproductive/Obstetrics                             Anesthesia Physical Anesthesia Plan  ASA: III  Anesthesia Plan: MAC   Post-op Pain Management:    Induction: Intravenous  PONV Risk Score and Plan: Ondansetron and Propofol infusion  Airway Management Planned: Natural Airway and Nasal Cannula  Additional Equipment:   Intra-op Plan:   Post-operative Plan:   Informed Consent: I have reviewed the patients History and Physical, chart, labs and discussed the procedure including the risks, benefits and alternatives for the proposed anesthesia with the patient or authorized representative who has indicated his/her understanding and acceptance.   Dental advisory given  Plan Discussed with: CRNA and Anesthesiologist  Anesthesia Plan Comments:         Anesthesia Quick Evaluation

## 2018-02-12 NOTE — Transfer of Care (Signed)
Immediate Anesthesia Transfer of Care Note  Patient: Cody Turner  Procedure(s) Performed: COLONOSCOPY WITH PROPOFOL (N/A )  Patient Location: PACU and Endoscopy Unit  Anesthesia Type:MAC  Level of Consciousness: awake and alert   Airway & Oxygen Therapy: Patient Spontanous Breathing and Patient connected to face mask oxygen  Post-op Assessment: Report given to RN and Post -op Vital signs reviewed and stable  Post vital signs: Reviewed  Last Vitals:  Vitals Value Taken Time  BP    Temp    Pulse 56 02/12/2018 12:01 PM  Resp 18 02/12/2018 12:01 PM  SpO2 98 % 02/12/2018 12:01 PM  Vitals shown include unvalidated device data.  Last Pain:  Vitals:   02/12/18 1046  TempSrc: Oral  PainSc: 0-No pain         Complications: No apparent anesthesia complications

## 2018-02-12 NOTE — Interval H&P Note (Signed)
History and Physical Interval Note:  02/12/2018 11:15 AM  Cody Turner  has presented today for surgery, with the diagnosis of abnormal contents in stool, chronic anticoagulation/bmi over 50/db  The various methods of treatment have been discussed with the patient and family. After consideration of risks, benefits and other options for treatment, the patient has consented to  Procedure(s): COLONOSCOPY WITH PROPOFOL (N/A) as a surgical intervention .  The patient's history has been reviewed, patient examined, no change in status, stable for surgery.  I have reviewed the patient's chart and labs.  Questions were answered to the patient's satisfaction.     [name]

## 2018-02-12 NOTE — Anesthesia Postprocedure Evaluation (Signed)
Anesthesia Post Note  Patient: Cody Turner  Procedure(s) Performed: COLONOSCOPY WITH PROPOFOL (N/A )     Patient location during evaluation: PACU Anesthesia Type: MAC Level of consciousness: awake and alert Pain management: pain level controlled Vital Signs Assessment: post-procedure vital signs reviewed and stable Respiratory status: spontaneous breathing, nonlabored ventilation, respiratory function stable and patient connected to nasal cannula oxygen Cardiovascular status: stable and blood pressure returned to baseline Postop Assessment: no apparent nausea or vomiting Anesthetic complications: no    Last Vitals:  Vitals:   02/12/18 1220 02/12/18 1225  BP: (!) 147/53   Pulse: (!) 59 (!) 58  Resp: 13 12  Temp:    SpO2: 99% 98%    Last Pain:  Vitals:   02/12/18 1210  TempSrc:   PainSc: 0-No pain                 Jeromiah Ohalloran COKER

## 2018-02-12 NOTE — Discharge Instructions (Signed)
YOU HAD AN ENDOSCOPIC PROCEDURE TODAY: Refer to the procedure report and other information in the discharge instructions given to you for any specific questions about what was found during the examination. If this information does not answer your questions, please call [facility] office at [phone] to clarify.   YOU SHOULD EXPECT: Some feelings of bloating in the abdomen. Passage of more gas than usual. Walking can help get rid of the air that was put into your GI tract during the procedure and reduce the bloating. If you had a lower endoscopy (such as a colonoscopy or flexible sigmoidoscopy) you may notice spotting of blood in your stool or on the toilet paper. Some abdominal soreness may be present for a day or two, also.  DIET: Your first meal following the procedure should be a light meal and then it is ok to progress to your normal diet. A half-sandwich or bowl of soup is an example of a good first meal. Heavy or fried foods are harder to digest and may make you feel nauseous or bloated. Drink plenty of fluids but you should avoid alcoholic beverages for 24 hours. If you had a esophageal dilation, please see attached instructions for diet.    ACTIVITY: Your care partner should take you home directly after the procedure. You should plan to take it easy, moving slowly for the rest of the day. You can resume normal activity the day after the procedure however YOU SHOULD NOT DRIVE, use power tools, machinery or perform tasks that involve climbing or major physical exertion for 24 hours (because of the sedation medicines used during the test).   SYMPTOMS TO REPORT IMMEDIATELY: A gastroenterologist can be reached at any hour. Please call [phone]  for any of the following symptoms:  Following lower endoscopy (colonoscopy, flexible sigmoidoscopy) Excessive amounts of blood in the stool  Significant tenderness, worsening of abdominal pains  Swelling of the abdomen that is new, acute  Fever of 100 or  higher   __________________________________________________________________   !!!!!!!!!!!!!!!!!!!!!!  Resume coumadin today and follow your primary care provider's instructions regarding its dosing and the continued use of lovenox injections until the coumadin is therapeutic.  !!!!!!!!!!!!!!!!!!!!!!!! FOLLOW UP:  If any biopsies were taken you will be contacted by phone or by letter within the next 1-3 weeks. Call [phone]  if you have not heard about the biopsies in 3 weeks.  Please also call with any specific questions about appointments or follow up tests.

## 2018-02-12 NOTE — Telephone Encounter (Signed)
Please schedule this patient for a repeat colonoscopy in my next available [facility] outpatient block. See colonoscopy report from today. He did bridging Lovenox per instructions by PCP, but they told him to do lovenox the AM of procedure, so I could not remove the 2 polyps I found.  Also, prep poor.  Needs 2-day prep with miralax and Suprep ,last coumadin dose 5 days prior like he did this time,  bridging lovenox BUT with last dose of lovenox the evening prior to procedure rather than the morning of.  Patient knows this will be the plan.

## 2018-02-12 NOTE — Anesthesia Procedure Notes (Signed)
Procedure Name: MAC Date/Time: 02/12/2018 11:23 AM Performed by: Cynda Familia, CRNA Pre-anesthesia Checklist: Patient identified, Emergency Drugs available, Suction available, Patient being monitored and Timeout performed Patient Re-evaluated:Patient Re-evaluated prior to induction Oxygen Delivery Method: Simple face mask Placement Confirmation: positive ETCO2 and breath sounds checked- equal and bilateral Dental Injury: Teeth and Oropharynx as per pre-operative assessment

## 2018-02-12 NOTE — Op Note (Signed)
[facility] Patient Name: Cody Turner Procedure Date: 02/12/2018 MRN: [id number] Attending MD: Estill Cotta. Loletha Carrow , MD Date of Birth: July 08, 1961 CSN: [id number] Age: 57 Admit Type: Inpatient Procedure:                Colonoscopy Indications:              Positive Cologuard test Providers:                Mallie Mussel L. Loletha Carrow, MD, Cleda Daub, RN, William Dalton, Technician Referring MD:             Kennith Maes, MD Medicines:                Monitored Anesthesia Care Complications:            No immediate complications. Estimated Blood Loss:     Estimated blood loss: none. Procedure:                Pre-Anesthesia Assessment:                           - Prior to the procedure, a History and Physical                            was performed, and patient medications and                            allergies were reviewed. The patient's tolerance of                            previous anesthesia was also reviewed. The risks                            and benefits of the procedure and the sedation                            options and risks were discussed with the patient.                            All questions were answered, and informed consent                            was obtained. Prior Anticoagulants: The patient                            last took Coumadin (warfarin) 5 days prior to the                            procedure and last took Lovenox (enoxaparin) on the                            day of the procedure (there was a miscommunication  regarding management of this medicine). ASA Grade                            Assessment: III - A patient with severe systemic                            disease. After reviewing the risks and benefits,                            the patient was deemed in satisfactory condition to                            undergo the procedure.                           After obtaining informed  consent, the colonoscope                            was passed under direct vision. Throughout the                            procedure, the patient's blood pressure, pulse, and                            oxygen saturations were monitored continuously. The                            EC-3890LI ([id number]) scope was introduced through                            the anus and advanced to the the cecum, identified                            by appendiceal orifice and ileocecal valve. The                            colonoscopy was performed with difficulty due to a                            redundant colon and the patient's body habitus.                            Successful completion of the procedure was aided by                            using manual pressure and rolling patient partially                            toward the back. The patient tolerated the                            procedure. The quality of the bowel preparation was  poor. The quality of the bowel preparation was                            evaluated using the BBPS [facility] Bowel Preparation                            Scale) with scores of: Right Colon = 1, Transverse                            Colon = 1 and Left Colon = 1. The total BBPS score                            equals 3. Scope In: 11:31:13 AM Scope Out: 11:50:30 AM Scope Withdrawal Time: 0 hours 8 minutes 54 seconds  Total Procedure Duration: 0 hours 19 minutes 17 seconds  Findings:      The perianal and digital rectal examinations were normal.      A 10 mm polyp was found in the distal ascending colon. The polyp was       sessile.      A 5 mm polyp was found in the rectum. The polyp was pedunculated and       ulcerated.      The colon (entire examined portion) was redundant.      Retroflexion in the rectum was not performed due to anatomy.      The exam was otherwise without abnormality, given the limitations of the       bowel  preparation.      Neither polyp was removed since the patient had been given lovenox the       morning of procedure. Also, the bowel preparation was poor. Repeat       colonoscopy needed. Impression:               - Preparation of the colon was poor.                           - One 10 mm polyp in the distal ascending colon.                           - One 5 mm polyp in the rectum.                           - Redundant colon.                           - The examination was otherwise normal.                           - No specimens collected. Moderate Sedation:      MAC sedation used Recommendation:           - Patient has a contact number available for                            emergencies. The signs and symptoms of potential  delayed complications were discussed with the                            patient. Return to normal activities tomorrow.                            Written discharge instructions were provided to the                            patient.                           - Resume previous diet.                           - Resume Coumadin (warfarin) at prior dose today.                            Refer to primary physician for further adjustment                            of therapy.                           - Repeat colonoscopy within 3 months with 2 DAY                            BOWEL PREP AND WITH BRIDGING LOVENOX PLAN THAT                            GIVES LAST LOVENOX INJECTION EVENING PRIOR TO                            PROCEDURE. Procedure Code(s):        --- Professional ---                           [phone], Colonoscopy, flexible; diagnostic, including                            collection of specimen(s) by brushing or washing,                            when performed (separate procedure) Diagnosis Code(s):        --- Professional ---                           D12.2, Benign neoplasm of ascending colon                           K62.1,  Rectal polyp                           R19.5, Other fecal abnormalities                           Q43.8, Other  specified congenital malformations of                            intestine CPT copyright 2017 American Medical Association. All rights reserved. The codes documented in this report are preliminary and upon coder review may  be revised to meet current compliance requirements. Henry L. Loletha Carrow, MD 02/12/2018 12:02:05 PM This report has been signed electronically. Number of Addenda: 0

## 2018-02-12 NOTE — Anesthesia Procedure Notes (Signed)
Date/Time: 02/12/2018 11:36 AM Performed by: Cynda Familia, CRNA Airway Equipment and Method: Oral airway Comments: OA for obstruction Joslin present

## 2018-02-14 ENCOUNTER — Other Ambulatory Visit: Payer: Self-pay

## 2018-02-14 NOTE — Telephone Encounter (Signed)
Faxed anticoagulation letter to Dr. Aris Everts at [phone].

## 2018-02-14 NOTE — Telephone Encounter (Signed)
Patient is scheduled for a pre-visit on 04/18/18 at 11:00 here in [facility], and his hospital colonoscopy is on 04/30/18 at 08:00 am at [facility]. I will send a request to his PCP who handles the coumadin and lovenox bridge. I called patient but did not get an answer, left message that I will be sending a letter with these appointment date and that he needs to contact his PCP about coumadin/lovenox.

## 2018-02-15 ENCOUNTER — Encounter (HOSPITAL_COMMUNITY): Payer: Self-pay | Admitting: Gastroenterology

## 2018-02-19 ENCOUNTER — Ambulatory Visit: Payer: BC Managed Care – PPO | Admitting: Internal Medicine

## 2018-03-28 ENCOUNTER — Telehealth: Payer: Self-pay

## 2018-03-28 NOTE — Telephone Encounter (Signed)
I have not heard back from Dr. Greggory Keen office re: coumadin and Lovenox bridge. Faxed letter again to Dr. Greggory Keen office. See phone note from 02/12/18 about dosing.

## 2018-03-30 ENCOUNTER — Telehealth: Payer: Self-pay

## 2018-03-30 NOTE — Telephone Encounter (Signed)
Received okay from Dr. Helene Kelp to hold coumadin 5 days prior with a lovenox bridge. Patient received the prescription and instructions from their office. Will scan in confirmation we received from Dr. Helene Kelp on 03/28/18. Patient should be aware that the last dose of lovenox should be the evening prior to the procedure per Dr. Loletha Carrow' instructions.

## 2018-04-18 ENCOUNTER — Ambulatory Visit (AMBULATORY_SURGERY_CENTER): Payer: Self-pay | Admitting: *Deleted

## 2018-04-18 VITALS — Ht 74.0 in | Wt >= 6400 oz

## 2018-04-18 DIAGNOSIS — Z86010 Personal history of colonic polyps: Secondary | ICD-10-CM

## 2018-04-18 MED ORDER — NA SULFATE-K SULFATE-MG SULF 17.5-3.13-1.6 GM/177ML PO SOLN: 1.0000 | Freq: Once | ORAL | 0 refills | Status: AC

## 2018-04-18 NOTE — Progress Notes (Signed)
Denies allergies to eggs or soy products. Denies complications with sedation or anesthesia. Denies O2 use. Denies use of diet or weight loss medications.  Emmi instructions sent to patient's email.

## 2018-04-30 ENCOUNTER — Other Ambulatory Visit: Payer: Self-pay

## 2018-04-30 ENCOUNTER — Ambulatory Visit (HOSPITAL_COMMUNITY): Payer: BC Managed Care – PPO | Admitting: Registered Nurse

## 2018-04-30 ENCOUNTER — Encounter (HOSPITAL_COMMUNITY)
Admission: RE | Disposition: A | Discharge status: Home / Self Care | Payer: Self-pay | Source: Ambulatory Visit | Attending: Gastroenterology

## 2018-04-30 ENCOUNTER — Ambulatory Visit (HOSPITAL_COMMUNITY)
Admission: RE | Admit: 2018-04-30 | Discharge: 2018-04-30 | Disposition: A | Discharge status: Home / Self Care | Payer: BC Managed Care – PPO | Source: Ambulatory Visit | Attending: Gastroenterology | Admitting: Gastroenterology

## 2018-04-30 ENCOUNTER — Encounter (HOSPITAL_COMMUNITY): Payer: Self-pay | Admitting: Registered Nurse

## 2018-04-30 DIAGNOSIS — Z860100 Personal history of colon polyps, unspecified: Secondary | ICD-10-CM

## 2018-04-30 DIAGNOSIS — D128 Benign neoplasm of rectum: Secondary | ICD-10-CM

## 2018-04-30 DIAGNOSIS — D124 Benign neoplasm of descending colon: Secondary | ICD-10-CM | POA: Diagnosis not present

## 2018-04-30 DIAGNOSIS — Z86718 Personal history of other venous thrombosis and embolism: Secondary | ICD-10-CM | POA: Diagnosis not present

## 2018-04-30 DIAGNOSIS — Z7901 Long term (current) use of anticoagulants: Secondary | ICD-10-CM | POA: Insufficient documentation

## 2018-04-30 DIAGNOSIS — Z79899 Other long term (current) drug therapy: Secondary | ICD-10-CM | POA: Insufficient documentation

## 2018-04-30 DIAGNOSIS — K621 Rectal polyp: Secondary | ICD-10-CM | POA: Insufficient documentation

## 2018-04-30 DIAGNOSIS — I10 Essential (primary) hypertension: Secondary | ICD-10-CM | POA: Diagnosis not present

## 2018-04-30 DIAGNOSIS — D123 Benign neoplasm of transverse colon: Secondary | ICD-10-CM | POA: Diagnosis not present

## 2018-04-30 DIAGNOSIS — Z6841 Body Mass Index (BMI) 40.0 and over, adult: Secondary | ICD-10-CM | POA: Insufficient documentation

## 2018-04-30 DIAGNOSIS — Z1211 Encounter for screening for malignant neoplasm of colon: Secondary | ICD-10-CM | POA: Diagnosis not present

## 2018-04-30 DIAGNOSIS — D125 Benign neoplasm of sigmoid colon: Secondary | ICD-10-CM | POA: Diagnosis not present

## 2018-04-30 DIAGNOSIS — Z87891 Personal history of nicotine dependence: Secondary | ICD-10-CM | POA: Diagnosis not present

## 2018-04-30 DIAGNOSIS — G4730 Sleep apnea, unspecified: Secondary | ICD-10-CM | POA: Insufficient documentation

## 2018-04-30 DIAGNOSIS — Z880 Allergy status to penicillin: Secondary | ICD-10-CM | POA: Insufficient documentation

## 2018-04-30 DIAGNOSIS — Z7982 Long term (current) use of aspirin: Secondary | ICD-10-CM | POA: Diagnosis not present

## 2018-04-30 DIAGNOSIS — D6862 Lupus anticoagulant syndrome: Secondary | ICD-10-CM | POA: Diagnosis not present

## 2018-04-30 DIAGNOSIS — Z9989 Dependence on other enabling machines and devices: Secondary | ICD-10-CM | POA: Diagnosis not present

## 2018-04-30 HISTORY — PX: COLONOSCOPY WITH PROPOFOL: SHX5780

## 2018-04-30 HISTORY — PX: POLYPECTOMY: SHX5525

## 2018-04-30 SURGERY — COLONOSCOPY WITH PROPOFOL
Anesthesia: Monitor Anesthesia Care

## 2018-04-30 MED ORDER — PROPOFOL 10 MG/ML IV BOLUS
INTRAVENOUS | Status: AC
  Filled 2018-04-30: qty 20

## 2018-04-30 MED ORDER — LACTATED RINGERS IV SOLN
INTRAVENOUS | Status: DC
  Administered 2018-04-30: 1000 mL via INTRAVENOUS

## 2018-04-30 MED ORDER — PROPOFOL 10 MG/ML IV BOLUS
INTRAVENOUS | Status: DC | PRN
  Administered 2018-04-30 (×2): 20 mg via INTRAVENOUS

## 2018-04-30 MED ORDER — LIDOCAINE 2% (20 MG/ML) 5 ML SYRINGE
INTRAMUSCULAR | Status: DC | PRN
  Administered 2018-04-30: 100 mg via INTRAVENOUS

## 2018-04-30 MED ORDER — PROPOFOL 500 MG/50ML IV EMUL
INTRAVENOUS | Status: DC | PRN
  Administered 2018-04-30: 100 ug/kg/min via INTRAVENOUS

## 2018-04-30 MED ORDER — PROPOFOL 10 MG/ML IV BOLUS
INTRAVENOUS | Status: AC
  Filled 2018-04-30: qty 60

## 2018-04-30 SURGICAL SUPPLY — 21 items

## 2018-04-30 NOTE — Transfer of Care (Signed)
Immediate Anesthesia Transfer of Care Note  Patient: SNEIJDER BERNARDS  Procedure(s) Performed: COLONOSCOPY WITH PROPOFOL (N/A ) POLYPECTOMY  Patient Location: PACU  Anesthesia Type:MAC  Level of Consciousness: sedated  Airway & Oxygen Therapy: Patient Spontanous Breathing and Patient connected to face mask oxygen  Post-op Assessment: Report given to RN and Post -op Vital signs reviewed and stable  Post vital signs: Reviewed and stable  Last Vitals:  Vitals Value Taken Time  BP    Temp    Pulse 88 04/30/2018  8:38 AM  Resp 21 04/30/2018  8:38 AM  SpO2 93 % 04/30/2018  8:38 AM  Vitals shown include unvalidated device data.  Last Pain:  Vitals:   04/30/18 0652  TempSrc: Oral  PainSc: 0-No pain         Complications: No apparent anesthesia complications

## 2018-04-30 NOTE — Anesthesia Postprocedure Evaluation (Signed)
Anesthesia Post Note  Patient: Cody Turner  Procedure(s) Performed: COLONOSCOPY WITH PROPOFOL (N/A ) POLYPECTOMY     Patient location during evaluation: PACU Anesthesia Type: MAC Level of consciousness: awake and alert Pain management: pain level controlled Vital Signs Assessment: post-procedure vital signs reviewed and stable Respiratory status: spontaneous breathing, nonlabored ventilation, respiratory function stable and patient connected to nasal cannula oxygen Cardiovascular status: stable and blood pressure returned to baseline Postop Assessment: no apparent nausea or vomiting Anesthetic complications: no    Last Vitals:  Vitals:   04/30/18 0840 04/30/18 0850  BP: (!) 158/81 (!) 146/83  Pulse: 87 91  Resp: 19 14  Temp:    SpO2: 95% 98%    Last Pain:  Vitals:   04/30/18 0850  TempSrc:   PainSc: 0-No pain                 Cali Hope S

## 2018-04-30 NOTE — Anesthesia Procedure Notes (Signed)
Date/Time: 04/30/2018 7:53 AM Performed by: Talbot Grumbling, CRNA Oxygen Delivery Method: Simple face mask

## 2018-04-30 NOTE — Op Note (Signed)
[facility] Patient Name: Cody Turner Procedure Date: 04/30/2018 MRN: [id number] Attending MD: Estill Cotta. Loletha Carrow , MD Date of Birth: 20-Jan-1961 CSN: [id number] Age: 57 Admit Type: Outpatient Procedure:                Colonoscopy Indications:              Personal history of colonic polyps (at least one                            adeomatous-appearing polyp 02/2018 on exam with poor                            prep and anticoagulation) Providers:                Estill Cotta. Loletha Carrow, MD, Angus Seller, Janie Billups,                            Technician, Marla Roe, CRNA Referring MD:              Medicines:                Monitored Anesthesia Care Complications:            No immediate complications. Estimated Blood Loss:     Estimated blood loss was minimal. Procedure:                Pre-Anesthesia Assessment:                           - Prior to the procedure, a History and Physical                            was performed, and patient medications and                            allergies were reviewed. The patient's tolerance of                            previous anesthesia was also reviewed. The risks                            and benefits of the procedure and the sedation                            options and risks were discussed with the patient.                            All questions were answered, and informed consent                            was obtained. Prior Anticoagulants: The patient                            last took aspirin 1 day, Coumadin (warfarin) 5 days  and Lovenox (enoxaparin) 1 day prior to the                            procedure. ASA Grade Assessment: III - A patient                            with severe systemic disease. After reviewing the                            risks and benefits, the patient was deemed in                            satisfactory condition to undergo the procedure.  After obtaining informed consent, the colonoscope                            was passed under direct vision. Throughout the                            procedure, the patient's blood pressure, pulse, and                            oxygen saturations were monitored continuously. The                            EC-[id number] ([id number]) scope was introduced through                            the anus and advanced to the the cecum, identified                            by appendiceal orifice and ileocecal valve. The                            colonoscopy was performed with difficulty due to a                            redundant colon. The patient tolerated the                            procedure. The quality of the bowel preparation was                            good after lavage. The ileocecal valve, appendiceal                            orifice, and rectum were photographed. The bowel                            preparation used was 2 day Suprep/Miralax. Scope In: 7:59:01 AM Scope Out: 8:32:03 AM Scope Withdrawal Time: 0 hours 24 minutes 48 seconds  Total Procedure Duration: 0 hours 33 minutes 2 seconds  Findings:      The perianal and digital rectal examinations  were normal.      A 10 mm polyp was found in the hepatic flexure. The polyp was sessile.       The polyp was removed with a hot snare. Resection and retrieval were       complete. To prevent bleeding post-intervention, one hemostatic clip was       successfully placed (MR conditional). (Jar 2)      Four sessile polyps were found in the sigmoid colon and descending       colon. The polyps were 4 to 6 mm in size. These polyps were removed with       a cold snare. Resection and retrieval were complete. (Jar 1)      A 5 mm polyp was found in the distal rectum. The polyp was       semi-pedunculated and ulcerated. The polyp was removed with a hot snare.       Resection and retrieval were complete. For hemostasis, one hemostatic       clip  was successfully placed (MR conditional). There was no bleeding at       the end of the procedure. (Jar 3)      Retroflexion in the rectum was not performed due to anatomy. Impression:               - One 10 mm polyp at the hepatic flexure, removed                            with a hot snare. Resected and retrieved. Clip (MR                            conditional) was placed.                           - Four 4 to 6 mm polyps in the sigmoid colon and in                            the descending colon, removed with a cold snare.                            Resected and retrieved.                           - One 5 mm polyp in the rectum, removed with a hot                            snare. Resected and retrieved. Clip (MR                            conditional) was placed. Moderate Sedation:      N/A- Per Anesthesia Care Recommendation:           - Patient has a contact number available for                            emergencies. The signs and symptoms of potential  delayed complications were discussed with the                            patient. Return to normal activities tomorrow.                            Written discharge instructions were provided to the                            patient.                           - Resume previous diet.                           - Resume Coumadin (warfarin) tonight and resume                            Lovenox (enoxaparin) tonight at prior doses.                           - Await pathology results.                           - Repeat colonoscopy is recommended for                            surveillance. The colonoscopy date will be                            determined after pathology results from today's                            exam become available for review. Procedure Code(s):        --- Professional ---                           [phone], Colonoscopy, flexible; with removal of                            tumor(s),  polyp(s), or other lesion(s) by snare                            technique Diagnosis Code(s):        --- Professional ---                           Z86.010, Personal history of colonic polyps                           D12.3, Benign neoplasm of transverse colon (hepatic                            flexure or splenic flexure)                           K62.1, Rectal  polyp                           D12.5, Benign neoplasm of sigmoid colon                           D12.4, Benign neoplasm of descending colon CPT copyright 2017 American Medical Association. All rights reserved. The codes documented in this report are preliminary and upon coder review may  be revised to meet current compliance requirements. Arthuro Canelo L. Loletha Carrow, MD 04/30/2018 8:42:47 AM This report has been signed electronically. Number of Addenda: 0

## 2018-04-30 NOTE — Anesthesia Preprocedure Evaluation (Signed)
Anesthesia Evaluation  Patient identified by MRN, date of birth, ID band Patient awake    Reviewed: Allergy & Precautions, NPO status , Patient's Chart, lab work & pertinent test results  Airway Mallampati: III  TM Distance: <3 FB Neck ROM: Full    Dental no notable dental hx.    Pulmonary sleep apnea , former smoker,    breath sounds clear to auscultation + decreased breath sounds      Cardiovascular hypertension, Normal cardiovascular exam Rhythm:Regular Rate:Normal     Neuro/Psych negative neurological ROS  negative psych ROS   GI/Hepatic negative GI ROS, Neg liver ROS,   Endo/Other  negative endocrine ROSMorbid obesity  Renal/GU Renal disease  negative genitourinary   Musculoskeletal negative musculoskeletal ROS (+)   Abdominal (+) + obese,   Peds negative pediatric ROS (+)  Hematology negative hematology ROS (+)   Anesthesia Other Findings   Reproductive/Obstetrics negative OB ROS                             Anesthesia Physical Anesthesia Plan  ASA: IV  Anesthesia Plan: MAC   Post-op Pain Management:    Induction: Intravenous  PONV Risk Score and Plan: 0  Airway Management Planned: Simple Face Mask  Additional Equipment:   Intra-op Plan:   Post-operative Plan:   Informed Consent: I have reviewed the patients History and Physical, chart, labs and discussed the procedure including the risks, benefits and alternatives for the proposed anesthesia with the patient or authorized representative who has indicated his/her understanding and acceptance.   Dental advisory given  Plan Discussed with: CRNA and Surgeon  Anesthesia Plan Comments:         Anesthesia Quick Evaluation

## 2018-04-30 NOTE — Discharge Instructions (Signed)
Resume coumadin this evening. Resume Lovenox this evening.  _________________________________________________________________________________________________________ YOU HAD AN ENDOSCOPIC PROCEDURE TODAY: Refer to the procedure report and other information in the discharge instructions given to you for any specific questions about what was found during the examination. If this information does not answer your questions, please call [place] office at [phone] to clarify.   YOU SHOULD EXPECT: Some feelings of bloating in the abdomen. Passage of more gas than usual. Walking can help get rid of the air that was put into your GI tract during the procedure and reduce the bloating. If you had a lower endoscopy (such as a colonoscopy or flexible sigmoidoscopy) you may notice spotting of blood in your stool or on the toilet paper. Some abdominal soreness may be present for a day or two, also.  DIET: Your first meal following the procedure should be a light meal and then it is ok to progress to your normal diet. A half-sandwich or bowl of soup is an example of a good first meal. Heavy or fried foods are harder to digest and may make you feel nauseous or bloated. Drink plenty of fluids but you should avoid alcoholic beverages for 24 hours. If you had a esophageal dilation, please see attached instructions for diet.    ACTIVITY: Your care partner should take you home directly after the procedure. You should plan to take it easy, moving slowly for the rest of the day. You can resume normal activity the day after the procedure however YOU SHOULD NOT DRIVE, use power tools, machinery or perform tasks that involve climbing or major physical exertion for 24 hours (because of the sedation medicines used during the test).   SYMPTOMS TO REPORT IMMEDIATELY: A gastroenterologist can be reached at any hour. Please call [phone]  for any of the following symptoms:  Following lower endoscopy (colonoscopy, flexible  sigmoidoscopy) Excessive amounts of blood in the stool  Significant tenderness, worsening of abdominal pains  Swelling of the abdomen that is new, acute  Fever of 100 or higher  Following upper endoscopy (EGD, EUS, ERCP, esophageal dilation) Vomiting of blood or coffee ground material  New, significant abdominal pain  New, significant chest pain or pain under the shoulder blades  Painful or persistently difficult swallowing  New shortness of breath  Black, tarry-looking or red, bloody stools  FOLLOW UP:  If any biopsies were taken you will be contacted by phone or by letter within the next 1-3 weeks. Call [phone]  if you have not heard about the biopsies in 3 weeks.  Please also call with any specific questions about appointments or follow up tests.

## 2018-04-30 NOTE — H&P (Signed)
History:  This patient presents for endoscopic testing for history colon polyps.  Corwin Levins Referring physician: Ronita Hipps, MD  Past Medical History: Past Medical History:  Diagnosis Date  . Factor V Leiden ([place])   . Hx of blood clots   . Hypertension   . Kidney stones   . 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 Obesity   . Pneumonia   . Sleep apnea    wear bipap  . Ventral hernia      Past Surgical History: Past Surgical History:  Procedure Laterality Date  . APPENDECTOMY  1975  . COLONOSCOPY WITH PROPOFOL N/A 02/12/2018   Procedure: COLONOSCOPY WITH PROPOFOL;  Surgeon: Doran Stabler, MD;  Location: [facility];  Service: Gastroenterology;  Laterality: N/A;  . FINGER TENDON REPAIR Left 1985  . hematoma removal Right 2006   shin  . HERNIA REPAIR  2005  . ingrown toenail removal  Bilateral 2004  . [place]  . REVISION TOTAL HIP ARTHROPLASTY Left 2005  . sinus biopsy  2003  . staph infection removal  2003   at pin site   . traction rod removal.  2003    Allergies: Allergies  Allergen Reactions  . Augmentin [Amoxicillin-Pot Clavulanate] Itching, Swelling and Rash    Has patient had a PCN reaction causing immediate rash, facial/tongue/throat swelling, SOB or lightheadedness with hypotension: Yes Has patient had a PCN reaction causing severe rash involving mucus membranes or skin necrosis: No Has patient had a PCN reaction that required hospitalization: No Has patient had a PCN reaction occurring within the last 10 years: Yes If all of the above answers are "NO", then may proceed with Cephalosporin use.     Outpatient Meds: Current Facility-Administered Medications  Medication Dose Route Frequency Provider Last Rate Last Dose  . lactated ringers infusion   Intravenous Continuous [name], MD 10 mL/hr at 04/30/18 0658 1,000 mL at 04/30/18 0658       ___________________________________________________________________ Objective   Exam:  BP (!) 190/98   Pulse 89   Temp 97.8 F (36.6 C) (Oral)   Resp 15   Ht 6\' 2"  (1.88 m)   Wt (!) 436 lb (197.8 kg)   SpO2 98%   BMI 55.98 kg/m   Morbidly obese  CV: RRR without murmur, S1/S2, no JVD, no peripheral edema  Resp: clear to auscultation bilaterally, normal RR and effort noted  GI: soft, no tenderness, with active bowel sounds. No guarding or palpable organomegaly noted.  Neuro: awake, alert and oriented x 3. Normal gross motor function and fluent speech   Assessment:  Colon polyps found May 2019 - could not be removed due to poor prep and lovenox  Plan:  Colonoscopy with polypectomy   [name]

## 2018-04-30 NOTE — Interval H&P Note (Signed)
History and Physical Interval Note:  04/30/2018 7:46 AM  Cody Turner  has presented today for surgery, with the diagnosis of positive cologard test, repeat due to poor prep  The various methods of treatment have been discussed with the patient and family. After consideration of risks, benefits and other options for treatment, the patient has consented to  Procedure(s): COLONOSCOPY WITH PROPOFOL (N/A) as a surgical intervention .  The patient's history has been reviewed, patient examined, no change in status, stable for surgery.  I have reviewed the patient's chart and labs.  Questions were answered to the patient's satisfaction.     [name]

## 2018-05-01 ENCOUNTER — Encounter: Payer: Self-pay | Admitting: Gastroenterology

## 2018-05-03 ENCOUNTER — Encounter (HOSPITAL_COMMUNITY): Payer: Self-pay | Admitting: Gastroenterology

## 2018-08-27 ENCOUNTER — Telehealth: Payer: Self-pay | Admitting: Internal Medicine

## 2018-08-27 NOTE — Telephone Encounter (Signed)
Called patient unable to reach left message to give us a call back.

## 2018-08-28 NOTE — Telephone Encounter (Signed)
Called and spoke with pt who stated he had received a notice from the Disability services that stated pt was on his own to try to get things taken care of for the determination of disability. Pt stated there should have been information scanned into his chart that had a barcode on it that was to be sent to the Disability Dept.  I stated to pt that I did find that information. Pt stated they were going to need his OV's that he had with MW sent to them. Stated to pt I would fax this to the Disability dept for him. Pt expressed understanding. Information has been faxed. Nothing further needed.

## 2018-09-28 ENCOUNTER — Ambulatory Visit: Payer: Self-pay | Admitting: Sports Medicine

## 2019-10-13 DIAGNOSIS — G4733 Obstructive sleep apnea (adult) (pediatric): Secondary | ICD-10-CM | POA: Diagnosis not present

## 2019-10-29 DIAGNOSIS — J310 Chronic rhinitis: Secondary | ICD-10-CM | POA: Diagnosis not present

## 2019-10-29 DIAGNOSIS — G4733 Obstructive sleep apnea (adult) (pediatric): Secondary | ICD-10-CM | POA: Diagnosis not present

## 2019-10-29 DIAGNOSIS — J342 Deviated nasal septum: Secondary | ICD-10-CM | POA: Diagnosis not present

## 2019-10-29 DIAGNOSIS — J3489 Other specified disorders of nose and nasal sinuses: Secondary | ICD-10-CM | POA: Diagnosis not present

## 2019-10-29 DIAGNOSIS — Z87891 Personal history of nicotine dependence: Secondary | ICD-10-CM | POA: Diagnosis not present

## 2019-10-30 DIAGNOSIS — Z86718 Personal history of other venous thrombosis and embolism: Secondary | ICD-10-CM | POA: Diagnosis not present

## 2019-10-30 DIAGNOSIS — I10 Essential (primary) hypertension: Secondary | ICD-10-CM | POA: Diagnosis not present

## 2019-10-30 DIAGNOSIS — D6862 Lupus anticoagulant syndrome: Secondary | ICD-10-CM | POA: Diagnosis not present

## 2019-10-30 DIAGNOSIS — Z7901 Long term (current) use of anticoagulants: Secondary | ICD-10-CM | POA: Diagnosis not present

## 2019-11-02 DIAGNOSIS — G4733 Obstructive sleep apnea (adult) (pediatric): Secondary | ICD-10-CM | POA: Diagnosis not present

## 2019-11-04 DIAGNOSIS — D6862 Lupus anticoagulant syndrome: Secondary | ICD-10-CM | POA: Diagnosis not present

## 2019-11-04 DIAGNOSIS — I4891 Unspecified atrial fibrillation: Secondary | ICD-10-CM | POA: Diagnosis not present

## 2019-11-04 DIAGNOSIS — Z5181 Encounter for therapeutic drug level monitoring: Secondary | ICD-10-CM | POA: Diagnosis not present

## 2019-11-04 DIAGNOSIS — Z7901 Long term (current) use of anticoagulants: Secondary | ICD-10-CM | POA: Diagnosis not present

## 2019-11-13 DIAGNOSIS — H9012 Conductive hearing loss, unilateral, left ear, with unrestricted hearing on the contralateral side: Secondary | ICD-10-CM | POA: Diagnosis not present

## 2019-11-13 DIAGNOSIS — H6992 Unspecified Eustachian tube disorder, left ear: Secondary | ICD-10-CM | POA: Diagnosis not present

## 2019-11-13 DIAGNOSIS — Z9622 Myringotomy tube(s) status: Secondary | ICD-10-CM | POA: Diagnosis not present

## 2019-11-13 DIAGNOSIS — H9072 Mixed conductive and sensorineural hearing loss, unilateral, left ear, with unrestricted hearing on the contralateral side: Secondary | ICD-10-CM | POA: Diagnosis not present

## 2019-12-03 DIAGNOSIS — I10 Essential (primary) hypertension: Secondary | ICD-10-CM | POA: Diagnosis not present

## 2019-12-03 DIAGNOSIS — R7301 Impaired fasting glucose: Secondary | ICD-10-CM | POA: Diagnosis not present

## 2019-12-03 DIAGNOSIS — Z6841 Body Mass Index (BMI) 40.0 and over, adult: Secondary | ICD-10-CM | POA: Diagnosis not present

## 2019-12-03 DIAGNOSIS — R635 Abnormal weight gain: Secondary | ICD-10-CM | POA: Diagnosis not present

## 2019-12-03 DIAGNOSIS — Z1329 Encounter for screening for other suspected endocrine disorder: Secondary | ICD-10-CM | POA: Diagnosis not present

## 2019-12-03 DIAGNOSIS — Z13220 Encounter for screening for lipoid disorders: Secondary | ICD-10-CM | POA: Diagnosis not present

## 2019-12-03 DIAGNOSIS — E669 Obesity, unspecified: Secondary | ICD-10-CM | POA: Diagnosis not present

## 2019-12-03 DIAGNOSIS — Z01818 Encounter for other preprocedural examination: Secondary | ICD-10-CM | POA: Diagnosis not present

## 2019-12-03 DIAGNOSIS — G4733 Obstructive sleep apnea (adult) (pediatric): Secondary | ICD-10-CM | POA: Diagnosis not present

## 2019-12-05 DIAGNOSIS — D6862 Lupus anticoagulant syndrome: Secondary | ICD-10-CM | POA: Diagnosis not present

## 2019-12-05 DIAGNOSIS — Z7901 Long term (current) use of anticoagulants: Secondary | ICD-10-CM | POA: Diagnosis not present

## 2019-12-20 DIAGNOSIS — D6862 Lupus anticoagulant syndrome: Secondary | ICD-10-CM | POA: Diagnosis not present

## 2019-12-20 DIAGNOSIS — Z7901 Long term (current) use of anticoagulants: Secondary | ICD-10-CM | POA: Diagnosis not present

## 2019-12-25 DIAGNOSIS — D6851 Activated protein C resistance: Secondary | ICD-10-CM | POA: Diagnosis not present

## 2019-12-25 DIAGNOSIS — I10 Essential (primary) hypertension: Secondary | ICD-10-CM | POA: Diagnosis not present

## 2019-12-25 DIAGNOSIS — D6862 Lupus anticoagulant syndrome: Secondary | ICD-10-CM | POA: Diagnosis not present

## 2019-12-25 DIAGNOSIS — Z7901 Long term (current) use of anticoagulants: Secondary | ICD-10-CM | POA: Diagnosis not present

## 2019-12-30 DIAGNOSIS — S8992XA Unspecified injury of left lower leg, initial encounter: Secondary | ICD-10-CM | POA: Diagnosis not present

## 2019-12-31 DIAGNOSIS — J310 Chronic rhinitis: Secondary | ICD-10-CM | POA: Diagnosis not present

## 2019-12-31 DIAGNOSIS — Z87891 Personal history of nicotine dependence: Secondary | ICD-10-CM | POA: Diagnosis not present

## 2019-12-31 DIAGNOSIS — J3489 Other specified disorders of nose and nasal sinuses: Secondary | ICD-10-CM | POA: Diagnosis not present

## 2019-12-31 DIAGNOSIS — J342 Deviated nasal septum: Secondary | ICD-10-CM | POA: Diagnosis not present

## 2019-12-31 DIAGNOSIS — R234 Changes in skin texture: Secondary | ICD-10-CM | POA: Diagnosis not present

## 2019-12-31 DIAGNOSIS — G4733 Obstructive sleep apnea (adult) (pediatric): Secondary | ICD-10-CM | POA: Diagnosis not present

## 2020-01-02 DIAGNOSIS — Z6841 Body Mass Index (BMI) 40.0 and over, adult: Secondary | ICD-10-CM | POA: Diagnosis not present

## 2020-01-02 DIAGNOSIS — I10 Essential (primary) hypertension: Secondary | ICD-10-CM | POA: Diagnosis not present

## 2020-01-02 DIAGNOSIS — E669 Obesity, unspecified: Secondary | ICD-10-CM | POA: Diagnosis not present

## 2020-01-04 DIAGNOSIS — S81012A Laceration without foreign body, left knee, initial encounter: Secondary | ICD-10-CM | POA: Diagnosis not present

## 2020-01-14 DIAGNOSIS — S81012D Laceration without foreign body, left knee, subsequent encounter: Secondary | ICD-10-CM | POA: Diagnosis not present

## 2020-01-24 DIAGNOSIS — G4733 Obstructive sleep apnea (adult) (pediatric): Secondary | ICD-10-CM | POA: Diagnosis not present

## 2020-01-31 DIAGNOSIS — G4733 Obstructive sleep apnea (adult) (pediatric): Secondary | ICD-10-CM | POA: Diagnosis not present

## 2020-02-04 DIAGNOSIS — R635 Abnormal weight gain: Secondary | ICD-10-CM | POA: Diagnosis not present

## 2020-02-04 DIAGNOSIS — R948 Abnormal results of function studies of other organs and systems: Secondary | ICD-10-CM | POA: Diagnosis not present

## 2020-02-04 DIAGNOSIS — E782 Mixed hyperlipidemia: Secondary | ICD-10-CM | POA: Diagnosis not present

## 2020-02-04 DIAGNOSIS — I10 Essential (primary) hypertension: Secondary | ICD-10-CM | POA: Diagnosis not present

## 2020-02-04 DIAGNOSIS — Z6841 Body Mass Index (BMI) 40.0 and over, adult: Secondary | ICD-10-CM | POA: Diagnosis not present

## 2020-02-05 DIAGNOSIS — Z125 Encounter for screening for malignant neoplasm of prostate: Secondary | ICD-10-CM | POA: Diagnosis not present

## 2020-02-05 DIAGNOSIS — Z0000 Encounter for general adult medical examination without abnormal findings: Secondary | ICD-10-CM | POA: Diagnosis not present

## 2020-02-05 DIAGNOSIS — Z1211 Encounter for screening for malignant neoplasm of colon: Secondary | ICD-10-CM | POA: Diagnosis not present

## 2020-02-05 DIAGNOSIS — I10 Essential (primary) hypertension: Secondary | ICD-10-CM | POA: Diagnosis not present

## 2020-02-05 DIAGNOSIS — Z13220 Encounter for screening for lipoid disorders: Secondary | ICD-10-CM | POA: Diagnosis not present

## 2020-02-05 DIAGNOSIS — E782 Mixed hyperlipidemia: Secondary | ICD-10-CM | POA: Diagnosis not present

## 2020-02-05 DIAGNOSIS — D6851 Activated protein C resistance: Secondary | ICD-10-CM | POA: Diagnosis not present

## 2020-02-05 DIAGNOSIS — D6862 Lupus anticoagulant syndrome: Secondary | ICD-10-CM | POA: Diagnosis not present

## 2020-02-05 DIAGNOSIS — Z131 Encounter for screening for diabetes mellitus: Secondary | ICD-10-CM | POA: Diagnosis not present

## 2020-02-07 IMAGING — DX DG CHEST 2V
2 series · 2 of 2 positions shown · non-contrast
Comparison: None.

CLINICAL DATA: 56-year-old male with a 7 day history of cough and
left anterior chest pain

EXAM:
CHEST - 2 VIEW

[chest pa]
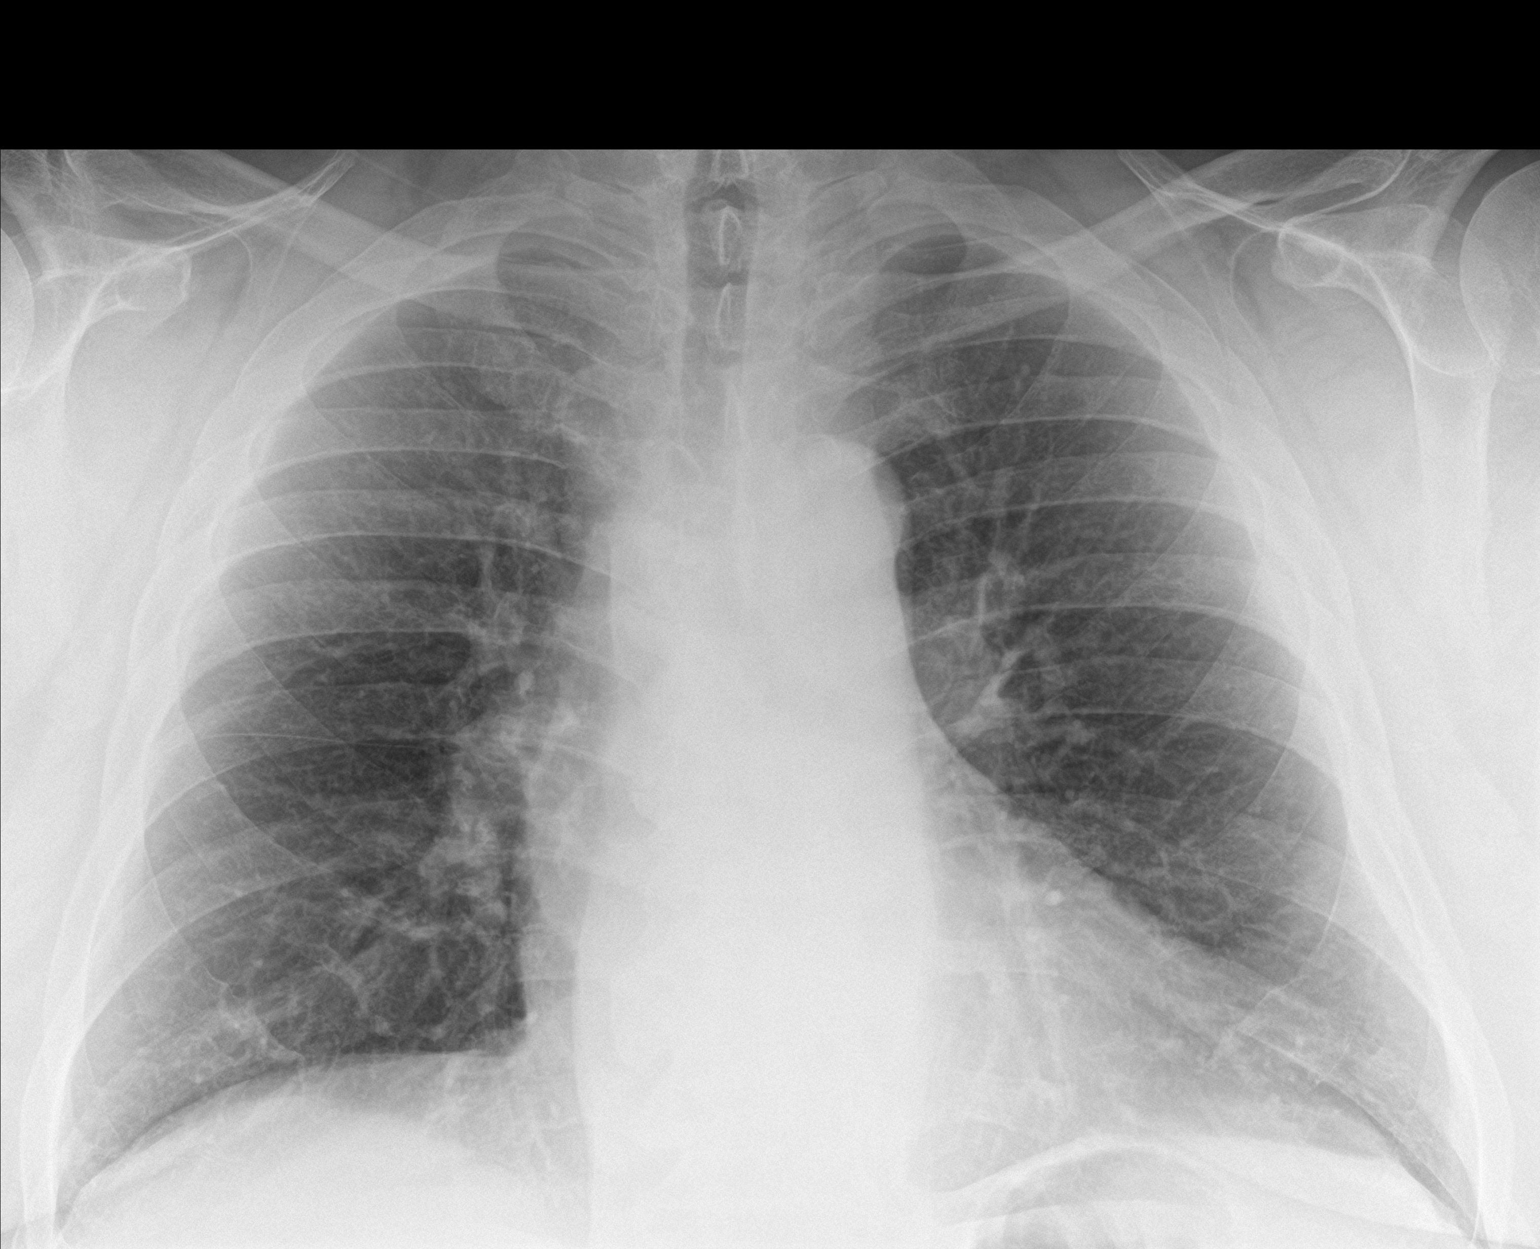

[chest lat]
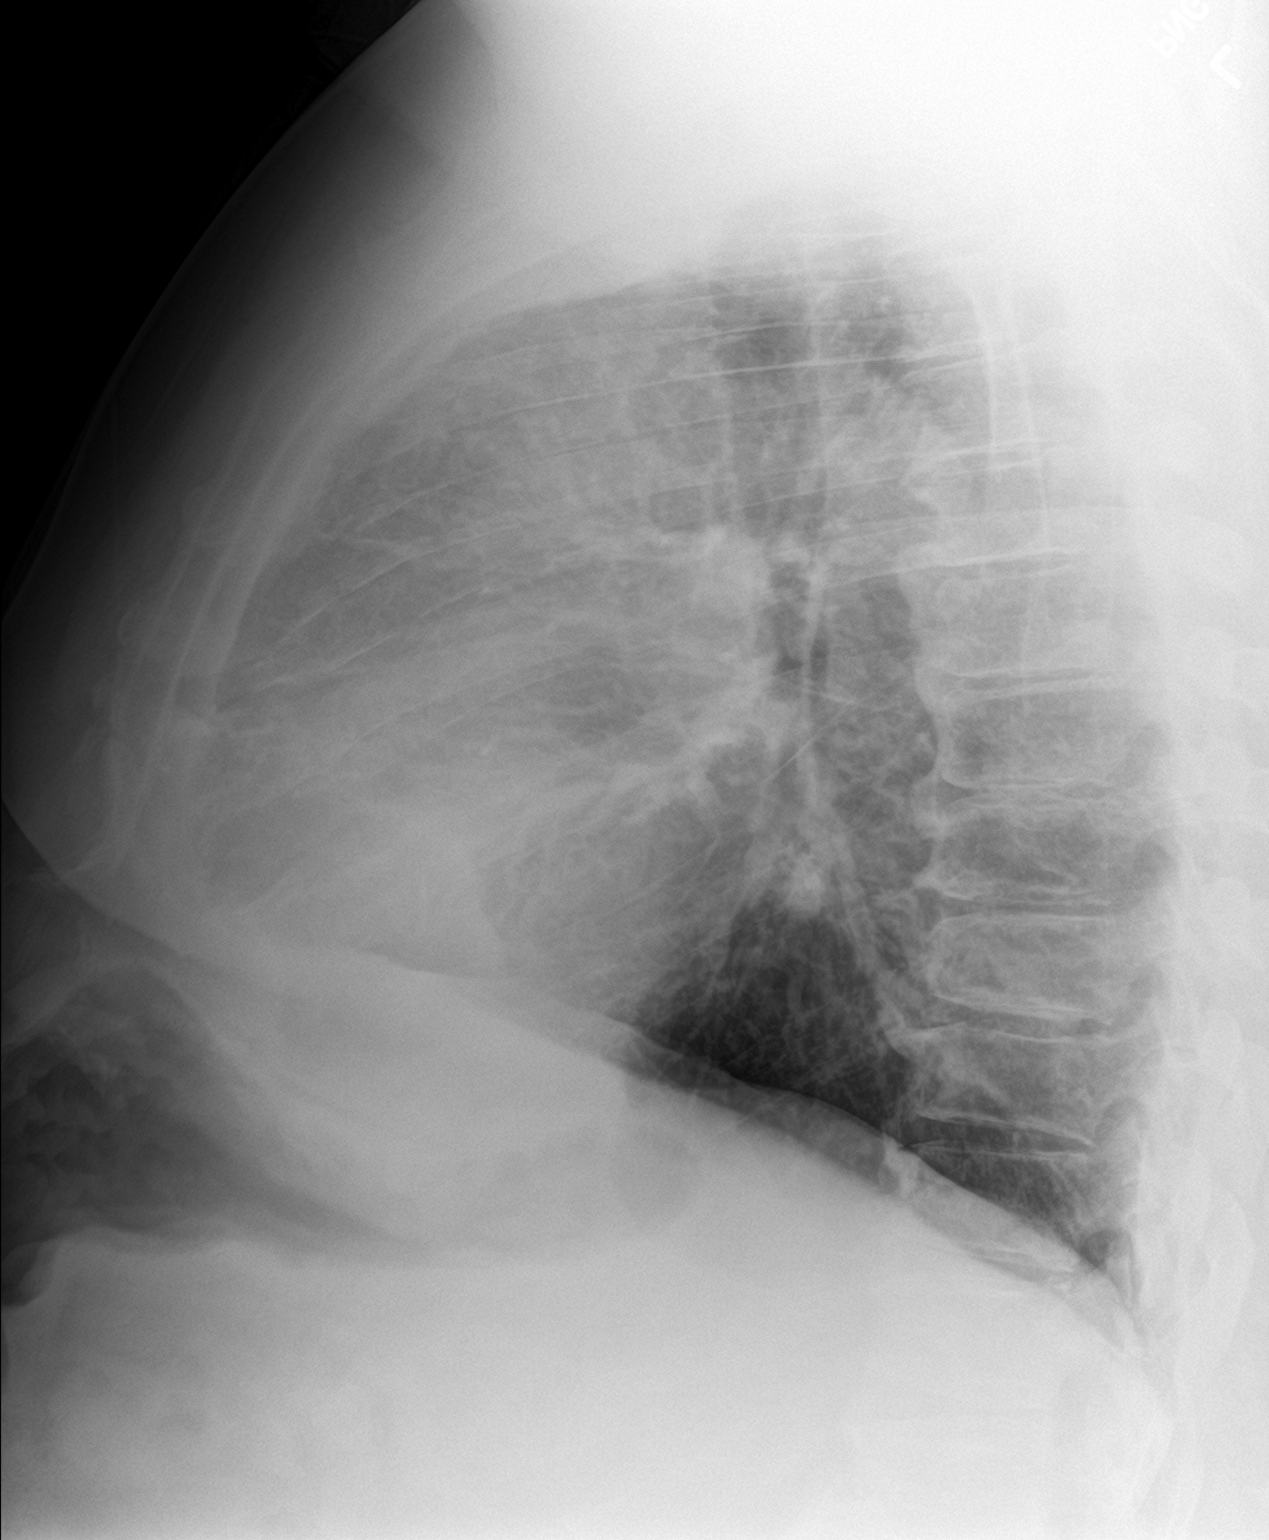

[2 of 2 positions shown; findings below may reference images not displayed]

FINDINGS: The lungs are clear and negative for focal airspace consolidation,
pulmonary edema or suspicious pulmonary nodule. No pleural effusion
or pneumothorax. Cardiac and mediastinal contours are within normal
limits. No acute fracture or lytic or blastic osseous lesions. The
visualized upper abdominal bowel gas pattern is unremarkable.
Multilevel degenerative disc disease throughout the thoracic spine.
IMPRESSION: No active cardiopulmonary disease.

## 2020-02-12 DIAGNOSIS — Z6841 Body Mass Index (BMI) 40.0 and over, adult: Secondary | ICD-10-CM | POA: Diagnosis not present

## 2020-02-12 DIAGNOSIS — Z713 Dietary counseling and surveillance: Secondary | ICD-10-CM | POA: Diagnosis not present

## 2020-02-27 DIAGNOSIS — K120 Recurrent oral aphthae: Secondary | ICD-10-CM | POA: Diagnosis not present

## 2020-03-01 DIAGNOSIS — G4733 Obstructive sleep apnea (adult) (pediatric): Secondary | ICD-10-CM | POA: Diagnosis not present

## 2020-03-03 DIAGNOSIS — Z87828 Personal history of other (healed) physical injury and trauma: Secondary | ICD-10-CM | POA: Diagnosis not present

## 2020-03-03 DIAGNOSIS — J3489 Other specified disorders of nose and nasal sinuses: Secondary | ICD-10-CM | POA: Diagnosis not present

## 2020-03-03 DIAGNOSIS — Z6841 Body Mass Index (BMI) 40.0 and over, adult: Secondary | ICD-10-CM | POA: Diagnosis not present

## 2020-03-03 DIAGNOSIS — J342 Deviated nasal septum: Secondary | ICD-10-CM | POA: Diagnosis not present

## 2020-03-03 DIAGNOSIS — J310 Chronic rhinitis: Secondary | ICD-10-CM | POA: Diagnosis not present

## 2020-03-03 DIAGNOSIS — K120 Recurrent oral aphthae: Secondary | ICD-10-CM | POA: Diagnosis not present

## 2020-03-03 DIAGNOSIS — Z87898 Personal history of other specified conditions: Secondary | ICD-10-CM | POA: Diagnosis not present

## 2020-03-03 DIAGNOSIS — Z87891 Personal history of nicotine dependence: Secondary | ICD-10-CM | POA: Diagnosis not present

## 2020-03-03 DIAGNOSIS — I10 Essential (primary) hypertension: Secondary | ICD-10-CM | POA: Diagnosis not present

## 2020-03-06 DIAGNOSIS — D6862 Lupus anticoagulant syndrome: Secondary | ICD-10-CM | POA: Diagnosis not present

## 2020-03-06 DIAGNOSIS — Z7901 Long term (current) use of anticoagulants: Secondary | ICD-10-CM | POA: Diagnosis not present

## 2020-04-01 DIAGNOSIS — G4733 Obstructive sleep apnea (adult) (pediatric): Secondary | ICD-10-CM | POA: Diagnosis not present

## 2020-04-06 DIAGNOSIS — Z7901 Long term (current) use of anticoagulants: Secondary | ICD-10-CM | POA: Diagnosis not present

## 2020-04-06 DIAGNOSIS — S81802A Unspecified open wound, left lower leg, initial encounter: Secondary | ICD-10-CM | POA: Diagnosis not present

## 2020-04-06 DIAGNOSIS — D6862 Lupus anticoagulant syndrome: Secondary | ICD-10-CM | POA: Diagnosis not present

## 2020-04-10 DIAGNOSIS — D6851 Activated protein C resistance: Secondary | ICD-10-CM | POA: Diagnosis not present

## 2020-04-10 DIAGNOSIS — S81802A Unspecified open wound, left lower leg, initial encounter: Secondary | ICD-10-CM | POA: Diagnosis not present

## 2020-04-10 DIAGNOSIS — Z7901 Long term (current) use of anticoagulants: Secondary | ICD-10-CM | POA: Diagnosis not present

## 2020-04-10 DIAGNOSIS — D6862 Lupus anticoagulant syndrome: Secondary | ICD-10-CM | POA: Diagnosis not present

## 2020-04-21 DIAGNOSIS — Z6841 Body Mass Index (BMI) 40.0 and over, adult: Secondary | ICD-10-CM | POA: Diagnosis not present

## 2020-04-21 DIAGNOSIS — I10 Essential (primary) hypertension: Secondary | ICD-10-CM | POA: Diagnosis not present

## 2020-04-24 DIAGNOSIS — G4733 Obstructive sleep apnea (adult) (pediatric): Secondary | ICD-10-CM | POA: Diagnosis not present

## 2020-05-01 DIAGNOSIS — G4733 Obstructive sleep apnea (adult) (pediatric): Secondary | ICD-10-CM | POA: Diagnosis not present

## 2020-05-05 DIAGNOSIS — J3489 Other specified disorders of nose and nasal sinuses: Secondary | ICD-10-CM | POA: Diagnosis not present

## 2020-05-05 DIAGNOSIS — J310 Chronic rhinitis: Secondary | ICD-10-CM | POA: Diagnosis not present

## 2020-05-05 DIAGNOSIS — Z87891 Personal history of nicotine dependence: Secondary | ICD-10-CM | POA: Diagnosis not present

## 2020-05-05 DIAGNOSIS — J342 Deviated nasal septum: Secondary | ICD-10-CM | POA: Diagnosis not present

## 2020-05-05 DIAGNOSIS — Z87828 Personal history of other (healed) physical injury and trauma: Secondary | ICD-10-CM | POA: Diagnosis not present

## 2020-06-01 DIAGNOSIS — G4733 Obstructive sleep apnea (adult) (pediatric): Secondary | ICD-10-CM | POA: Diagnosis not present

## 2020-06-04 DIAGNOSIS — Z6841 Body Mass Index (BMI) 40.0 and over, adult: Secondary | ICD-10-CM | POA: Diagnosis not present

## 2020-06-04 DIAGNOSIS — G4733 Obstructive sleep apnea (adult) (pediatric): Secondary | ICD-10-CM | POA: Diagnosis not present

## 2020-06-04 DIAGNOSIS — I10 Essential (primary) hypertension: Secondary | ICD-10-CM | POA: Diagnosis not present

## 2020-06-04 DIAGNOSIS — R635 Abnormal weight gain: Secondary | ICD-10-CM | POA: Diagnosis not present

## 2020-06-04 DIAGNOSIS — E782 Mixed hyperlipidemia: Secondary | ICD-10-CM | POA: Diagnosis not present

## 2020-06-26 DIAGNOSIS — E782 Mixed hyperlipidemia: Secondary | ICD-10-CM | POA: Diagnosis not present

## 2020-06-26 DIAGNOSIS — Z79899 Other long term (current) drug therapy: Secondary | ICD-10-CM | POA: Diagnosis not present

## 2020-06-26 DIAGNOSIS — Z13220 Encounter for screening for lipoid disorders: Secondary | ICD-10-CM | POA: Diagnosis not present

## 2020-06-26 DIAGNOSIS — I10 Essential (primary) hypertension: Secondary | ICD-10-CM | POA: Diagnosis not present

## 2020-06-26 DIAGNOSIS — R7301 Impaired fasting glucose: Secondary | ICD-10-CM | POA: Diagnosis not present

## 2020-06-26 DIAGNOSIS — Z7901 Long term (current) use of anticoagulants: Secondary | ICD-10-CM | POA: Diagnosis not present

## 2020-06-26 DIAGNOSIS — Z131 Encounter for screening for diabetes mellitus: Secondary | ICD-10-CM | POA: Diagnosis not present

## 2020-06-26 DIAGNOSIS — Z23 Encounter for immunization: Secondary | ICD-10-CM | POA: Diagnosis not present

## 2020-06-26 DIAGNOSIS — Z86718 Personal history of other venous thrombosis and embolism: Secondary | ICD-10-CM | POA: Diagnosis not present

## 2020-07-02 DIAGNOSIS — G4733 Obstructive sleep apnea (adult) (pediatric): Secondary | ICD-10-CM | POA: Diagnosis not present

## 2020-07-07 DIAGNOSIS — I10 Essential (primary) hypertension: Secondary | ICD-10-CM | POA: Diagnosis not present

## 2020-07-07 DIAGNOSIS — Z6841 Body Mass Index (BMI) 40.0 and over, adult: Secondary | ICD-10-CM | POA: Diagnosis not present

## 2020-07-14 DIAGNOSIS — Z9989 Dependence on other enabling machines and devices: Secondary | ICD-10-CM | POA: Diagnosis not present

## 2020-07-14 DIAGNOSIS — J310 Chronic rhinitis: Secondary | ICD-10-CM | POA: Diagnosis not present

## 2020-07-14 DIAGNOSIS — J3489 Other specified disorders of nose and nasal sinuses: Secondary | ICD-10-CM | POA: Diagnosis not present

## 2020-07-14 DIAGNOSIS — Z87891 Personal history of nicotine dependence: Secondary | ICD-10-CM | POA: Diagnosis not present

## 2020-07-14 DIAGNOSIS — R234 Changes in skin texture: Secondary | ICD-10-CM | POA: Diagnosis not present

## 2020-07-14 DIAGNOSIS — J342 Deviated nasal septum: Secondary | ICD-10-CM | POA: Diagnosis not present

## 2020-07-16 DIAGNOSIS — M47812 Spondylosis without myelopathy or radiculopathy, cervical region: Secondary | ICD-10-CM | POA: Diagnosis not present

## 2020-07-16 DIAGNOSIS — M542 Cervicalgia: Secondary | ICD-10-CM | POA: Diagnosis not present

## 2020-07-16 DIAGNOSIS — M19011 Primary osteoarthritis, right shoulder: Secondary | ICD-10-CM | POA: Diagnosis not present

## 2020-07-16 DIAGNOSIS — S161XXA Strain of muscle, fascia and tendon at neck level, initial encounter: Secondary | ICD-10-CM | POA: Diagnosis not present

## 2020-07-16 DIAGNOSIS — S46911A Strain of unspecified muscle, fascia and tendon at shoulder and upper arm level, right arm, initial encounter: Secondary | ICD-10-CM | POA: Diagnosis not present

## 2020-07-23 DIAGNOSIS — S161XXA Strain of muscle, fascia and tendon at neck level, initial encounter: Secondary | ICD-10-CM | POA: Diagnosis not present

## 2020-07-23 DIAGNOSIS — S46911A Strain of unspecified muscle, fascia and tendon at shoulder and upper arm level, right arm, initial encounter: Secondary | ICD-10-CM | POA: Diagnosis not present

## 2020-07-23 DIAGNOSIS — M25511 Pain in right shoulder: Secondary | ICD-10-CM | POA: Diagnosis not present

## 2020-07-27 DIAGNOSIS — G4733 Obstructive sleep apnea (adult) (pediatric): Secondary | ICD-10-CM | POA: Diagnosis not present

## 2020-07-28 DIAGNOSIS — D6851 Activated protein C resistance: Secondary | ICD-10-CM | POA: Diagnosis not present

## 2020-07-28 DIAGNOSIS — Z7901 Long term (current) use of anticoagulants: Secondary | ICD-10-CM | POA: Diagnosis not present

## 2020-07-28 DIAGNOSIS — D6862 Lupus anticoagulant syndrome: Secondary | ICD-10-CM | POA: Diagnosis not present

## 2020-07-31 DIAGNOSIS — M25511 Pain in right shoulder: Secondary | ICD-10-CM | POA: Diagnosis not present

## 2020-07-31 DIAGNOSIS — S46911A Strain of unspecified muscle, fascia and tendon at shoulder and upper arm level, right arm, initial encounter: Secondary | ICD-10-CM | POA: Diagnosis not present

## 2020-07-31 DIAGNOSIS — S161XXA Strain of muscle, fascia and tendon at neck level, initial encounter: Secondary | ICD-10-CM | POA: Diagnosis not present

## 2020-08-01 DIAGNOSIS — G4733 Obstructive sleep apnea (adult) (pediatric): Secondary | ICD-10-CM | POA: Diagnosis not present

## 2020-08-03 DIAGNOSIS — S81802A Unspecified open wound, left lower leg, initial encounter: Secondary | ICD-10-CM | POA: Diagnosis not present

## 2020-08-03 DIAGNOSIS — W01198A Fall on same level from slipping, tripping and stumbling with subsequent striking against other object, initial encounter: Secondary | ICD-10-CM | POA: Diagnosis not present

## 2020-08-07 DIAGNOSIS — S161XXA Strain of muscle, fascia and tendon at neck level, initial encounter: Secondary | ICD-10-CM | POA: Diagnosis not present

## 2020-08-07 DIAGNOSIS — S46911A Strain of unspecified muscle, fascia and tendon at shoulder and upper arm level, right arm, initial encounter: Secondary | ICD-10-CM | POA: Diagnosis not present

## 2020-08-07 DIAGNOSIS — M25511 Pain in right shoulder: Secondary | ICD-10-CM | POA: Diagnosis not present

## 2020-08-11 DIAGNOSIS — M25511 Pain in right shoulder: Secondary | ICD-10-CM | POA: Diagnosis not present

## 2020-08-11 DIAGNOSIS — S46911A Strain of unspecified muscle, fascia and tendon at shoulder and upper arm level, right arm, initial encounter: Secondary | ICD-10-CM | POA: Diagnosis not present

## 2020-08-11 DIAGNOSIS — S161XXA Strain of muscle, fascia and tendon at neck level, initial encounter: Secondary | ICD-10-CM | POA: Diagnosis not present

## 2020-08-12 DIAGNOSIS — Z23 Encounter for immunization: Secondary | ICD-10-CM | POA: Diagnosis not present

## 2020-08-12 DIAGNOSIS — Z7901 Long term (current) use of anticoagulants: Secondary | ICD-10-CM | POA: Diagnosis not present

## 2020-08-14 DIAGNOSIS — M25511 Pain in right shoulder: Secondary | ICD-10-CM | POA: Diagnosis not present

## 2020-08-14 DIAGNOSIS — S161XXA Strain of muscle, fascia and tendon at neck level, initial encounter: Secondary | ICD-10-CM | POA: Diagnosis not present

## 2020-08-14 DIAGNOSIS — S46911A Strain of unspecified muscle, fascia and tendon at shoulder and upper arm level, right arm, initial encounter: Secondary | ICD-10-CM | POA: Diagnosis not present

## 2020-08-18 DIAGNOSIS — S46911A Strain of unspecified muscle, fascia and tendon at shoulder and upper arm level, right arm, initial encounter: Secondary | ICD-10-CM | POA: Diagnosis not present

## 2020-08-18 DIAGNOSIS — M25511 Pain in right shoulder: Secondary | ICD-10-CM | POA: Diagnosis not present

## 2020-08-18 DIAGNOSIS — S161XXA Strain of muscle, fascia and tendon at neck level, initial encounter: Secondary | ICD-10-CM | POA: Diagnosis not present

## 2020-08-20 DIAGNOSIS — H60312 Diffuse otitis externa, left ear: Secondary | ICD-10-CM | POA: Diagnosis not present

## 2020-08-20 DIAGNOSIS — H7112 Cholesteatoma of tympanum, left ear: Secondary | ICD-10-CM | POA: Diagnosis not present

## 2020-08-21 DIAGNOSIS — S161XXA Strain of muscle, fascia and tendon at neck level, initial encounter: Secondary | ICD-10-CM | POA: Diagnosis not present

## 2020-08-21 DIAGNOSIS — M25511 Pain in right shoulder: Secondary | ICD-10-CM | POA: Diagnosis not present

## 2020-08-21 DIAGNOSIS — S46911A Strain of unspecified muscle, fascia and tendon at shoulder and upper arm level, right arm, initial encounter: Secondary | ICD-10-CM | POA: Diagnosis not present

## 2020-08-24 DIAGNOSIS — Z7901 Long term (current) use of anticoagulants: Secondary | ICD-10-CM | POA: Diagnosis not present

## 2020-08-25 DIAGNOSIS — S161XXA Strain of muscle, fascia and tendon at neck level, initial encounter: Secondary | ICD-10-CM | POA: Diagnosis not present

## 2020-08-25 DIAGNOSIS — M25511 Pain in right shoulder: Secondary | ICD-10-CM | POA: Diagnosis not present

## 2020-08-25 DIAGNOSIS — S46911A Strain of unspecified muscle, fascia and tendon at shoulder and upper arm level, right arm, initial encounter: Secondary | ICD-10-CM | POA: Diagnosis not present

## 2020-08-27 DIAGNOSIS — S161XXA Strain of muscle, fascia and tendon at neck level, initial encounter: Secondary | ICD-10-CM | POA: Diagnosis not present

## 2020-08-27 DIAGNOSIS — M25511 Pain in right shoulder: Secondary | ICD-10-CM | POA: Diagnosis not present

## 2020-08-27 DIAGNOSIS — S46911A Strain of unspecified muscle, fascia and tendon at shoulder and upper arm level, right arm, initial encounter: Secondary | ICD-10-CM | POA: Diagnosis not present

## 2020-09-01 DIAGNOSIS — Z9889 Other specified postprocedural states: Secondary | ICD-10-CM | POA: Diagnosis not present

## 2020-09-01 DIAGNOSIS — Z9622 Myringotomy tube(s) status: Secondary | ICD-10-CM | POA: Diagnosis not present

## 2020-09-01 DIAGNOSIS — Z7901 Long term (current) use of anticoagulants: Secondary | ICD-10-CM | POA: Diagnosis not present

## 2020-09-01 DIAGNOSIS — H7112 Cholesteatoma of tympanum, left ear: Secondary | ICD-10-CM | POA: Diagnosis not present

## 2020-09-01 DIAGNOSIS — G4733 Obstructive sleep apnea (adult) (pediatric): Secondary | ICD-10-CM | POA: Diagnosis not present

## 2020-09-02 DIAGNOSIS — S46911A Strain of unspecified muscle, fascia and tendon at shoulder and upper arm level, right arm, initial encounter: Secondary | ICD-10-CM | POA: Diagnosis not present

## 2020-09-02 DIAGNOSIS — M25511 Pain in right shoulder: Secondary | ICD-10-CM | POA: Diagnosis not present

## 2020-09-02 DIAGNOSIS — S161XXA Strain of muscle, fascia and tendon at neck level, initial encounter: Secondary | ICD-10-CM | POA: Diagnosis not present

## 2020-09-07 DIAGNOSIS — M25511 Pain in right shoulder: Secondary | ICD-10-CM | POA: Diagnosis not present

## 2020-09-07 DIAGNOSIS — S46911A Strain of unspecified muscle, fascia and tendon at shoulder and upper arm level, right arm, initial encounter: Secondary | ICD-10-CM | POA: Diagnosis not present

## 2020-09-07 DIAGNOSIS — S161XXA Strain of muscle, fascia and tendon at neck level, initial encounter: Secondary | ICD-10-CM | POA: Diagnosis not present

## 2020-09-10 DIAGNOSIS — M25511 Pain in right shoulder: Secondary | ICD-10-CM | POA: Diagnosis not present

## 2020-09-10 DIAGNOSIS — S46911A Strain of unspecified muscle, fascia and tendon at shoulder and upper arm level, right arm, initial encounter: Secondary | ICD-10-CM | POA: Diagnosis not present

## 2020-09-10 DIAGNOSIS — S161XXA Strain of muscle, fascia and tendon at neck level, initial encounter: Secondary | ICD-10-CM | POA: Diagnosis not present

## 2020-09-15 DIAGNOSIS — M25511 Pain in right shoulder: Secondary | ICD-10-CM | POA: Diagnosis not present

## 2020-09-15 DIAGNOSIS — S161XXA Strain of muscle, fascia and tendon at neck level, initial encounter: Secondary | ICD-10-CM | POA: Diagnosis not present

## 2020-09-15 DIAGNOSIS — S46911A Strain of unspecified muscle, fascia and tendon at shoulder and upper arm level, right arm, initial encounter: Secondary | ICD-10-CM | POA: Diagnosis not present

## 2020-09-17 DIAGNOSIS — Z6841 Body Mass Index (BMI) 40.0 and over, adult: Secondary | ICD-10-CM | POA: Diagnosis not present

## 2020-09-17 DIAGNOSIS — I10 Essential (primary) hypertension: Secondary | ICD-10-CM | POA: Diagnosis not present

## 2020-09-17 DIAGNOSIS — G4733 Obstructive sleep apnea (adult) (pediatric): Secondary | ICD-10-CM | POA: Diagnosis not present

## 2020-09-18 DIAGNOSIS — S161XXA Strain of muscle, fascia and tendon at neck level, initial encounter: Secondary | ICD-10-CM | POA: Diagnosis not present

## 2020-09-18 DIAGNOSIS — S46911A Strain of unspecified muscle, fascia and tendon at shoulder and upper arm level, right arm, initial encounter: Secondary | ICD-10-CM | POA: Diagnosis not present

## 2020-09-18 DIAGNOSIS — M25511 Pain in right shoulder: Secondary | ICD-10-CM | POA: Diagnosis not present

## 2020-09-21 DIAGNOSIS — T85618D Breakdown (mechanical) of other specified internal prosthetic devices, implants and grafts, subsequent encounter: Secondary | ICD-10-CM | POA: Diagnosis not present

## 2020-09-21 DIAGNOSIS — H7112 Cholesteatoma of tympanum, left ear: Secondary | ICD-10-CM | POA: Diagnosis not present

## 2020-09-23 DIAGNOSIS — M25511 Pain in right shoulder: Secondary | ICD-10-CM | POA: Diagnosis not present

## 2020-09-23 DIAGNOSIS — S46911A Strain of unspecified muscle, fascia and tendon at shoulder and upper arm level, right arm, initial encounter: Secondary | ICD-10-CM | POA: Diagnosis not present

## 2020-09-23 DIAGNOSIS — S161XXA Strain of muscle, fascia and tendon at neck level, initial encounter: Secondary | ICD-10-CM | POA: Diagnosis not present

## 2020-09-25 DIAGNOSIS — S46911A Strain of unspecified muscle, fascia and tendon at shoulder and upper arm level, right arm, initial encounter: Secondary | ICD-10-CM | POA: Diagnosis not present

## 2020-09-25 DIAGNOSIS — S161XXA Strain of muscle, fascia and tendon at neck level, initial encounter: Secondary | ICD-10-CM | POA: Diagnosis not present

## 2020-09-25 DIAGNOSIS — M25511 Pain in right shoulder: Secondary | ICD-10-CM | POA: Diagnosis not present

## 2020-09-28 DIAGNOSIS — S46911A Strain of unspecified muscle, fascia and tendon at shoulder and upper arm level, right arm, initial encounter: Secondary | ICD-10-CM | POA: Diagnosis not present

## 2020-09-28 DIAGNOSIS — S161XXA Strain of muscle, fascia and tendon at neck level, initial encounter: Secondary | ICD-10-CM | POA: Diagnosis not present

## 2020-09-28 DIAGNOSIS — M25511 Pain in right shoulder: Secondary | ICD-10-CM | POA: Diagnosis not present

## 2020-10-01 DIAGNOSIS — G4733 Obstructive sleep apnea (adult) (pediatric): Secondary | ICD-10-CM | POA: Diagnosis not present

## 2020-10-05 DIAGNOSIS — S161XXA Strain of muscle, fascia and tendon at neck level, initial encounter: Secondary | ICD-10-CM | POA: Diagnosis not present

## 2020-10-05 DIAGNOSIS — M25511 Pain in right shoulder: Secondary | ICD-10-CM | POA: Diagnosis not present

## 2020-10-05 DIAGNOSIS — S46911A Strain of unspecified muscle, fascia and tendon at shoulder and upper arm level, right arm, initial encounter: Secondary | ICD-10-CM | POA: Diagnosis not present

## 2020-10-06 DIAGNOSIS — Z7901 Long term (current) use of anticoagulants: Secondary | ICD-10-CM | POA: Diagnosis not present

## 2020-10-06 DIAGNOSIS — D6862 Lupus anticoagulant syndrome: Secondary | ICD-10-CM | POA: Diagnosis not present

## 2020-10-08 DIAGNOSIS — S46911A Strain of unspecified muscle, fascia and tendon at shoulder and upper arm level, right arm, initial encounter: Secondary | ICD-10-CM | POA: Diagnosis not present

## 2020-10-08 DIAGNOSIS — M25511 Pain in right shoulder: Secondary | ICD-10-CM | POA: Diagnosis not present

## 2020-10-08 DIAGNOSIS — S161XXA Strain of muscle, fascia and tendon at neck level, initial encounter: Secondary | ICD-10-CM | POA: Diagnosis not present

## 2020-10-12 DIAGNOSIS — S161XXA Strain of muscle, fascia and tendon at neck level, initial encounter: Secondary | ICD-10-CM | POA: Diagnosis not present

## 2020-10-12 DIAGNOSIS — M25511 Pain in right shoulder: Secondary | ICD-10-CM | POA: Diagnosis not present

## 2020-10-12 DIAGNOSIS — S46911A Strain of unspecified muscle, fascia and tendon at shoulder and upper arm level, right arm, initial encounter: Secondary | ICD-10-CM | POA: Diagnosis not present

## 2020-10-14 DIAGNOSIS — S161XXA Strain of muscle, fascia and tendon at neck level, initial encounter: Secondary | ICD-10-CM | POA: Diagnosis not present

## 2020-10-14 DIAGNOSIS — M25511 Pain in right shoulder: Secondary | ICD-10-CM | POA: Diagnosis not present

## 2020-10-14 DIAGNOSIS — S46911A Strain of unspecified muscle, fascia and tendon at shoulder and upper arm level, right arm, initial encounter: Secondary | ICD-10-CM | POA: Diagnosis not present

## 2020-10-15 DIAGNOSIS — Z9629 Presence of other otological and audiological implants: Secondary | ICD-10-CM | POA: Diagnosis not present

## 2020-10-15 DIAGNOSIS — J310 Chronic rhinitis: Secondary | ICD-10-CM | POA: Diagnosis not present

## 2020-10-15 DIAGNOSIS — Q303 Congenital perforated nasal septum: Secondary | ICD-10-CM | POA: Diagnosis not present

## 2020-10-15 DIAGNOSIS — H9212 Otorrhea, left ear: Secondary | ICD-10-CM | POA: Diagnosis not present

## 2020-10-15 DIAGNOSIS — J3489 Other specified disorders of nose and nasal sinuses: Secondary | ICD-10-CM | POA: Diagnosis not present

## 2020-10-15 DIAGNOSIS — Z9622 Myringotomy tube(s) status: Secondary | ICD-10-CM | POA: Diagnosis not present

## 2020-10-15 DIAGNOSIS — J342 Deviated nasal septum: Secondary | ICD-10-CM | POA: Diagnosis not present

## 2020-10-15 DIAGNOSIS — Z87891 Personal history of nicotine dependence: Secondary | ICD-10-CM | POA: Diagnosis not present

## 2020-10-19 DIAGNOSIS — Z7901 Long term (current) use of anticoagulants: Secondary | ICD-10-CM | POA: Diagnosis not present

## 2020-10-19 DIAGNOSIS — D6862 Lupus anticoagulant syndrome: Secondary | ICD-10-CM | POA: Diagnosis not present

## 2020-10-20 DIAGNOSIS — Z6841 Body Mass Index (BMI) 40.0 and over, adult: Secondary | ICD-10-CM | POA: Diagnosis not present

## 2020-10-22 DIAGNOSIS — S46011S Strain of muscle(s) and tendon(s) of the rotator cuff of right shoulder, sequela: Secondary | ICD-10-CM | POA: Diagnosis not present

## 2020-10-22 DIAGNOSIS — S46011D Strain of muscle(s) and tendon(s) of the rotator cuff of right shoulder, subsequent encounter: Secondary | ICD-10-CM | POA: Diagnosis not present

## 2020-10-27 DIAGNOSIS — G4733 Obstructive sleep apnea (adult) (pediatric): Secondary | ICD-10-CM | POA: Diagnosis not present

## 2020-11-01 DIAGNOSIS — G4733 Obstructive sleep apnea (adult) (pediatric): Secondary | ICD-10-CM | POA: Diagnosis not present

## 2020-11-03 DIAGNOSIS — J042 Acute laryngotracheitis: Secondary | ICD-10-CM | POA: Diagnosis not present

## 2020-11-11 DIAGNOSIS — M25411 Effusion, right shoulder: Secondary | ICD-10-CM | POA: Diagnosis not present

## 2020-11-11 DIAGNOSIS — S46811A Strain of other muscles, fascia and tendons at shoulder and upper arm level, right arm, initial encounter: Secondary | ICD-10-CM | POA: Diagnosis not present

## 2020-11-18 DIAGNOSIS — D6862 Lupus anticoagulant syndrome: Secondary | ICD-10-CM | POA: Diagnosis not present

## 2020-11-18 DIAGNOSIS — Z7901 Long term (current) use of anticoagulants: Secondary | ICD-10-CM | POA: Diagnosis not present

## 2020-11-19 DIAGNOSIS — S46011D Strain of muscle(s) and tendon(s) of the rotator cuff of right shoulder, subsequent encounter: Secondary | ICD-10-CM | POA: Diagnosis not present

## 2020-11-19 DIAGNOSIS — M75111 Incomplete rotator cuff tear or rupture of right shoulder, not specified as traumatic: Secondary | ICD-10-CM | POA: Diagnosis not present

## 2020-11-19 DIAGNOSIS — M19011 Primary osteoarthritis, right shoulder: Secondary | ICD-10-CM | POA: Diagnosis not present

## 2020-11-19 DIAGNOSIS — S46011S Strain of muscle(s) and tendon(s) of the rotator cuff of right shoulder, sequela: Secondary | ICD-10-CM | POA: Diagnosis not present

## 2020-12-02 DIAGNOSIS — Z7901 Long term (current) use of anticoagulants: Secondary | ICD-10-CM | POA: Diagnosis not present

## 2020-12-02 DIAGNOSIS — D6862 Lupus anticoagulant syndrome: Secondary | ICD-10-CM | POA: Diagnosis not present

## 2020-12-08 DIAGNOSIS — I10 Essential (primary) hypertension: Secondary | ICD-10-CM | POA: Diagnosis not present

## 2020-12-08 DIAGNOSIS — E785 Hyperlipidemia, unspecified: Secondary | ICD-10-CM | POA: Diagnosis not present

## 2020-12-08 DIAGNOSIS — Z6841 Body Mass Index (BMI) 40.0 and over, adult: Secondary | ICD-10-CM | POA: Diagnosis not present

## 2020-12-15 DIAGNOSIS — M75101 Unspecified rotator cuff tear or rupture of right shoulder, not specified as traumatic: Secondary | ICD-10-CM | POA: Diagnosis not present

## 2020-12-15 DIAGNOSIS — M7521 Bicipital tendinitis, right shoulder: Secondary | ICD-10-CM | POA: Diagnosis not present

## 2020-12-15 DIAGNOSIS — M19011 Primary osteoarthritis, right shoulder: Secondary | ICD-10-CM | POA: Diagnosis not present

## 2020-12-22 DIAGNOSIS — M25559 Pain in unspecified hip: Secondary | ICD-10-CM | POA: Diagnosis not present

## 2020-12-22 DIAGNOSIS — Z6841 Body Mass Index (BMI) 40.0 and over, adult: Secondary | ICD-10-CM | POA: Diagnosis not present

## 2020-12-22 DIAGNOSIS — I10 Essential (primary) hypertension: Secondary | ICD-10-CM | POA: Diagnosis not present

## 2020-12-30 DIAGNOSIS — M25559 Pain in unspecified hip: Secondary | ICD-10-CM | POA: Diagnosis not present

## 2020-12-30 DIAGNOSIS — M25551 Pain in right hip: Secondary | ICD-10-CM | POA: Diagnosis not present

## 2020-12-30 DIAGNOSIS — E669 Obesity, unspecified: Secondary | ICD-10-CM | POA: Diagnosis not present

## 2020-12-30 DIAGNOSIS — M545 Low back pain, unspecified: Secondary | ICD-10-CM | POA: Diagnosis not present

## 2021-01-01 DIAGNOSIS — D6862 Lupus anticoagulant syndrome: Secondary | ICD-10-CM | POA: Diagnosis not present

## 2021-01-01 DIAGNOSIS — Z7901 Long term (current) use of anticoagulants: Secondary | ICD-10-CM | POA: Diagnosis not present

## 2021-01-01 DIAGNOSIS — M545 Low back pain, unspecified: Secondary | ICD-10-CM | POA: Diagnosis not present

## 2021-01-01 DIAGNOSIS — R2689 Other abnormalities of gait and mobility: Secondary | ICD-10-CM | POA: Diagnosis not present

## 2021-01-05 DIAGNOSIS — M545 Low back pain, unspecified: Secondary | ICD-10-CM | POA: Diagnosis not present

## 2021-01-05 DIAGNOSIS — R2689 Other abnormalities of gait and mobility: Secondary | ICD-10-CM | POA: Diagnosis not present

## 2021-01-07 DIAGNOSIS — M545 Low back pain, unspecified: Secondary | ICD-10-CM | POA: Diagnosis not present

## 2021-01-07 DIAGNOSIS — R2689 Other abnormalities of gait and mobility: Secondary | ICD-10-CM | POA: Diagnosis not present

## 2021-01-14 DIAGNOSIS — Z9622 Myringotomy tube(s) status: Secondary | ICD-10-CM | POA: Diagnosis not present

## 2021-01-14 DIAGNOSIS — H6692 Otitis media, unspecified, left ear: Secondary | ICD-10-CM | POA: Diagnosis not present

## 2021-01-14 DIAGNOSIS — J3489 Other specified disorders of nose and nasal sinuses: Secondary | ICD-10-CM | POA: Diagnosis not present

## 2021-01-14 DIAGNOSIS — J342 Deviated nasal septum: Secondary | ICD-10-CM | POA: Diagnosis not present

## 2021-01-14 DIAGNOSIS — J310 Chronic rhinitis: Secondary | ICD-10-CM | POA: Diagnosis not present

## 2021-01-14 DIAGNOSIS — Z87891 Personal history of nicotine dependence: Secondary | ICD-10-CM | POA: Diagnosis not present

## 2021-01-15 DIAGNOSIS — R2689 Other abnormalities of gait and mobility: Secondary | ICD-10-CM | POA: Diagnosis not present

## 2021-01-15 DIAGNOSIS — M545 Low back pain, unspecified: Secondary | ICD-10-CM | POA: Diagnosis not present

## 2021-01-19 DIAGNOSIS — Z6841 Body Mass Index (BMI) 40.0 and over, adult: Secondary | ICD-10-CM | POA: Diagnosis not present

## 2021-01-19 DIAGNOSIS — E669 Obesity, unspecified: Secondary | ICD-10-CM | POA: Diagnosis not present

## 2021-01-20 DIAGNOSIS — R2689 Other abnormalities of gait and mobility: Secondary | ICD-10-CM | POA: Diagnosis not present

## 2021-01-20 DIAGNOSIS — M545 Low back pain, unspecified: Secondary | ICD-10-CM | POA: Diagnosis not present

## 2021-01-25 DIAGNOSIS — G4733 Obstructive sleep apnea (adult) (pediatric): Secondary | ICD-10-CM | POA: Diagnosis not present

## 2021-01-25 DIAGNOSIS — M545 Low back pain, unspecified: Secondary | ICD-10-CM | POA: Diagnosis not present

## 2021-01-25 DIAGNOSIS — R2689 Other abnormalities of gait and mobility: Secondary | ICD-10-CM | POA: Diagnosis not present

## 2021-01-28 DIAGNOSIS — R2689 Other abnormalities of gait and mobility: Secondary | ICD-10-CM | POA: Diagnosis not present

## 2021-01-28 DIAGNOSIS — M545 Low back pain, unspecified: Secondary | ICD-10-CM | POA: Diagnosis not present

## 2021-02-04 DIAGNOSIS — M19011 Primary osteoarthritis, right shoulder: Secondary | ICD-10-CM | POA: Diagnosis not present

## 2021-02-04 DIAGNOSIS — M75101 Unspecified rotator cuff tear or rupture of right shoulder, not specified as traumatic: Secondary | ICD-10-CM | POA: Diagnosis not present

## 2021-02-04 DIAGNOSIS — M7521 Bicipital tendinitis, right shoulder: Secondary | ICD-10-CM | POA: Diagnosis not present

## 2021-02-05 DIAGNOSIS — R2689 Other abnormalities of gait and mobility: Secondary | ICD-10-CM | POA: Diagnosis not present

## 2021-02-05 DIAGNOSIS — M545 Low back pain, unspecified: Secondary | ICD-10-CM | POA: Diagnosis not present

## 2021-02-09 DIAGNOSIS — M545 Low back pain, unspecified: Secondary | ICD-10-CM | POA: Diagnosis not present

## 2021-02-09 DIAGNOSIS — R2689 Other abnormalities of gait and mobility: Secondary | ICD-10-CM | POA: Diagnosis not present

## 2021-02-11 DIAGNOSIS — M545 Low back pain, unspecified: Secondary | ICD-10-CM | POA: Diagnosis not present

## 2021-02-11 DIAGNOSIS — R2689 Other abnormalities of gait and mobility: Secondary | ICD-10-CM | POA: Diagnosis not present

## 2021-02-15 DIAGNOSIS — M545 Low back pain, unspecified: Secondary | ICD-10-CM | POA: Diagnosis not present

## 2021-02-15 DIAGNOSIS — R2689 Other abnormalities of gait and mobility: Secondary | ICD-10-CM | POA: Diagnosis not present

## 2021-02-17 DIAGNOSIS — M25811 Other specified joint disorders, right shoulder: Secondary | ICD-10-CM | POA: Diagnosis not present

## 2021-02-17 DIAGNOSIS — M25711 Osteophyte, right shoulder: Secondary | ICD-10-CM | POA: Diagnosis not present

## 2021-02-17 DIAGNOSIS — M19011 Primary osteoarthritis, right shoulder: Secondary | ICD-10-CM | POA: Diagnosis not present

## 2021-02-17 DIAGNOSIS — M7521 Bicipital tendinitis, right shoulder: Secondary | ICD-10-CM | POA: Diagnosis not present

## 2021-02-17 DIAGNOSIS — M549 Dorsalgia, unspecified: Secondary | ICD-10-CM | POA: Diagnosis not present

## 2021-02-17 DIAGNOSIS — J3489 Other specified disorders of nose and nasal sinuses: Secondary | ICD-10-CM | POA: Diagnosis not present

## 2021-02-17 DIAGNOSIS — M24111 Other articular cartilage disorders, right shoulder: Secondary | ICD-10-CM | POA: Diagnosis not present

## 2021-02-17 DIAGNOSIS — G8918 Other acute postprocedural pain: Secondary | ICD-10-CM | POA: Diagnosis not present

## 2021-02-17 DIAGNOSIS — Z87891 Personal history of nicotine dependence: Secondary | ICD-10-CM | POA: Diagnosis not present

## 2021-02-17 DIAGNOSIS — M75111 Incomplete rotator cuff tear or rupture of right shoulder, not specified as traumatic: Secondary | ICD-10-CM | POA: Diagnosis not present

## 2021-02-17 DIAGNOSIS — Z7901 Long term (current) use of anticoagulants: Secondary | ICD-10-CM | POA: Diagnosis not present

## 2021-02-17 DIAGNOSIS — G8929 Other chronic pain: Secondary | ICD-10-CM | POA: Diagnosis not present

## 2021-02-17 DIAGNOSIS — M7541 Impingement syndrome of right shoulder: Secondary | ICD-10-CM | POA: Diagnosis not present

## 2021-02-17 DIAGNOSIS — D6851 Activated protein C resistance: Secondary | ICD-10-CM | POA: Diagnosis not present

## 2021-02-17 DIAGNOSIS — Z6841 Body Mass Index (BMI) 40.0 and over, adult: Secondary | ICD-10-CM | POA: Diagnosis not present

## 2021-02-17 DIAGNOSIS — I10 Essential (primary) hypertension: Secondary | ICD-10-CM | POA: Diagnosis not present

## 2021-02-17 DIAGNOSIS — G4733 Obstructive sleep apnea (adult) (pediatric): Secondary | ICD-10-CM | POA: Diagnosis not present

## 2021-02-17 DIAGNOSIS — Z7409 Other reduced mobility: Secondary | ICD-10-CM | POA: Diagnosis not present

## 2021-02-23 DIAGNOSIS — Z6841 Body Mass Index (BMI) 40.0 and over, adult: Secondary | ICD-10-CM | POA: Diagnosis not present

## 2021-02-23 DIAGNOSIS — I10 Essential (primary) hypertension: Secondary | ICD-10-CM | POA: Diagnosis not present

## 2021-02-25 DIAGNOSIS — Z7901 Long term (current) use of anticoagulants: Secondary | ICD-10-CM | POA: Diagnosis not present

## 2021-02-25 DIAGNOSIS — Z125 Encounter for screening for malignant neoplasm of prostate: Secondary | ICD-10-CM | POA: Diagnosis not present

## 2021-02-25 DIAGNOSIS — G4733 Obstructive sleep apnea (adult) (pediatric): Secondary | ICD-10-CM | POA: Diagnosis not present

## 2021-02-25 DIAGNOSIS — E782 Mixed hyperlipidemia: Secondary | ICD-10-CM | POA: Diagnosis not present

## 2021-02-25 DIAGNOSIS — R7303 Prediabetes: Secondary | ICD-10-CM | POA: Diagnosis not present

## 2021-02-25 DIAGNOSIS — D6862 Lupus anticoagulant syndrome: Secondary | ICD-10-CM | POA: Diagnosis not present

## 2021-02-25 DIAGNOSIS — Z0000 Encounter for general adult medical examination without abnormal findings: Secondary | ICD-10-CM | POA: Diagnosis not present

## 2021-02-25 DIAGNOSIS — Z86718 Personal history of other venous thrombosis and embolism: Secondary | ICD-10-CM | POA: Diagnosis not present

## 2021-02-25 DIAGNOSIS — I10 Essential (primary) hypertension: Secondary | ICD-10-CM | POA: Diagnosis not present

## 2021-03-03 DIAGNOSIS — D6862 Lupus anticoagulant syndrome: Secondary | ICD-10-CM | POA: Diagnosis not present

## 2021-03-03 DIAGNOSIS — Z5181 Encounter for therapeutic drug level monitoring: Secondary | ICD-10-CM | POA: Diagnosis not present

## 2021-03-03 DIAGNOSIS — Z86718 Personal history of other venous thrombosis and embolism: Secondary | ICD-10-CM | POA: Diagnosis not present

## 2021-03-03 DIAGNOSIS — Z7901 Long term (current) use of anticoagulants: Secondary | ICD-10-CM | POA: Diagnosis not present

## 2021-03-11 DIAGNOSIS — M545 Low back pain, unspecified: Secondary | ICD-10-CM | POA: Diagnosis not present

## 2021-03-11 DIAGNOSIS — R2689 Other abnormalities of gait and mobility: Secondary | ICD-10-CM | POA: Diagnosis not present

## 2021-03-29 DIAGNOSIS — Z4789 Encounter for other orthopedic aftercare: Secondary | ICD-10-CM | POA: Diagnosis not present

## 2021-04-03 DIAGNOSIS — J00 Acute nasopharyngitis [common cold]: Secondary | ICD-10-CM | POA: Diagnosis not present

## 2021-04-03 DIAGNOSIS — Z20822 Contact with and (suspected) exposure to covid-19: Secondary | ICD-10-CM | POA: Diagnosis not present

## 2021-04-05 DIAGNOSIS — Z7901 Long term (current) use of anticoagulants: Secondary | ICD-10-CM | POA: Diagnosis not present

## 2021-04-05 DIAGNOSIS — D6862 Lupus anticoagulant syndrome: Secondary | ICD-10-CM | POA: Diagnosis not present

## 2021-04-15 DIAGNOSIS — H6123 Impacted cerumen, bilateral: Secondary | ICD-10-CM | POA: Diagnosis not present

## 2021-04-15 DIAGNOSIS — Z4582 Encounter for adjustment or removal of myringotomy device (stent) (tube): Secondary | ICD-10-CM | POA: Diagnosis not present

## 2021-04-15 DIAGNOSIS — H6982 Other specified disorders of Eustachian tube, left ear: Secondary | ICD-10-CM | POA: Diagnosis not present

## 2021-04-19 DIAGNOSIS — Z86718 Personal history of other venous thrombosis and embolism: Secondary | ICD-10-CM | POA: Diagnosis not present

## 2021-04-19 DIAGNOSIS — D6862 Lupus anticoagulant syndrome: Secondary | ICD-10-CM | POA: Diagnosis not present

## 2021-04-19 DIAGNOSIS — Z7901 Long term (current) use of anticoagulants: Secondary | ICD-10-CM | POA: Diagnosis not present

## 2021-04-20 DIAGNOSIS — Z881 Allergy status to other antibiotic agents status: Secondary | ICD-10-CM | POA: Diagnosis not present

## 2021-04-20 DIAGNOSIS — J309 Allergic rhinitis, unspecified: Secondary | ICD-10-CM | POA: Diagnosis not present

## 2021-04-20 DIAGNOSIS — Z87891 Personal history of nicotine dependence: Secondary | ICD-10-CM | POA: Diagnosis not present

## 2021-04-20 DIAGNOSIS — J342 Deviated nasal septum: Secondary | ICD-10-CM | POA: Diagnosis not present

## 2021-04-20 DIAGNOSIS — J310 Chronic rhinitis: Secondary | ICD-10-CM | POA: Diagnosis not present

## 2021-04-20 DIAGNOSIS — J3489 Other specified disorders of nose and nasal sinuses: Secondary | ICD-10-CM | POA: Diagnosis not present

## 2021-04-22 DIAGNOSIS — D3132 Benign neoplasm of left choroid: Secondary | ICD-10-CM | POA: Diagnosis not present

## 2021-04-27 DIAGNOSIS — Z6841 Body Mass Index (BMI) 40.0 and over, adult: Secondary | ICD-10-CM | POA: Diagnosis not present

## 2021-04-27 DIAGNOSIS — I10 Essential (primary) hypertension: Secondary | ICD-10-CM | POA: Diagnosis not present

## 2021-04-29 DIAGNOSIS — Z7901 Long term (current) use of anticoagulants: Secondary | ICD-10-CM | POA: Diagnosis not present

## 2021-04-29 DIAGNOSIS — D6862 Lupus anticoagulant syndrome: Secondary | ICD-10-CM | POA: Diagnosis not present

## 2021-04-29 DIAGNOSIS — Z86718 Personal history of other venous thrombosis and embolism: Secondary | ICD-10-CM | POA: Diagnosis not present

## 2021-05-13 DIAGNOSIS — D6862 Lupus anticoagulant syndrome: Secondary | ICD-10-CM | POA: Diagnosis not present

## 2021-05-13 DIAGNOSIS — Z7901 Long term (current) use of anticoagulants: Secondary | ICD-10-CM | POA: Diagnosis not present

## 2021-05-13 DIAGNOSIS — U071 COVID-19: Secondary | ICD-10-CM | POA: Diagnosis not present

## 2021-05-13 DIAGNOSIS — Z86718 Personal history of other venous thrombosis and embolism: Secondary | ICD-10-CM | POA: Diagnosis not present

## 2021-05-20 DIAGNOSIS — D6862 Lupus anticoagulant syndrome: Secondary | ICD-10-CM | POA: Diagnosis not present

## 2021-05-20 DIAGNOSIS — Z86718 Personal history of other venous thrombosis and embolism: Secondary | ICD-10-CM | POA: Diagnosis not present

## 2021-05-20 DIAGNOSIS — Z7901 Long term (current) use of anticoagulants: Secondary | ICD-10-CM | POA: Diagnosis not present

## 2021-06-03 DIAGNOSIS — Z86718 Personal history of other venous thrombosis and embolism: Secondary | ICD-10-CM | POA: Diagnosis not present

## 2021-06-11 DIAGNOSIS — Z7901 Long term (current) use of anticoagulants: Secondary | ICD-10-CM | POA: Diagnosis not present

## 2021-06-11 DIAGNOSIS — Z86718 Personal history of other venous thrombosis and embolism: Secondary | ICD-10-CM | POA: Diagnosis not present

## 2021-07-06 DIAGNOSIS — G4733 Obstructive sleep apnea (adult) (pediatric): Secondary | ICD-10-CM | POA: Diagnosis not present

## 2021-07-14 DIAGNOSIS — Z86718 Personal history of other venous thrombosis and embolism: Secondary | ICD-10-CM | POA: Diagnosis not present

## 2021-07-14 DIAGNOSIS — Z7901 Long term (current) use of anticoagulants: Secondary | ICD-10-CM | POA: Diagnosis not present

## 2021-07-14 DIAGNOSIS — D6862 Lupus anticoagulant syndrome: Secondary | ICD-10-CM | POA: Diagnosis not present

## 2021-07-22 DIAGNOSIS — J342 Deviated nasal septum: Secondary | ICD-10-CM | POA: Diagnosis not present

## 2021-07-22 DIAGNOSIS — J310 Chronic rhinitis: Secondary | ICD-10-CM | POA: Diagnosis not present

## 2021-07-22 DIAGNOSIS — J3489 Other specified disorders of nose and nasal sinuses: Secondary | ICD-10-CM | POA: Diagnosis not present

## 2021-07-29 DIAGNOSIS — Z86718 Personal history of other venous thrombosis and embolism: Secondary | ICD-10-CM | POA: Diagnosis not present

## 2021-07-29 DIAGNOSIS — D6862 Lupus anticoagulant syndrome: Secondary | ICD-10-CM | POA: Diagnosis not present

## 2021-07-29 DIAGNOSIS — Z7901 Long term (current) use of anticoagulants: Secondary | ICD-10-CM | POA: Diagnosis not present

## 2021-08-12 DIAGNOSIS — Z86718 Personal history of other venous thrombosis and embolism: Secondary | ICD-10-CM | POA: Diagnosis not present

## 2021-08-12 DIAGNOSIS — Z7901 Long term (current) use of anticoagulants: Secondary | ICD-10-CM | POA: Diagnosis not present

## 2021-08-12 DIAGNOSIS — D6862 Lupus anticoagulant syndrome: Secondary | ICD-10-CM | POA: Diagnosis not present

## 2021-08-12 DIAGNOSIS — Z23 Encounter for immunization: Secondary | ICD-10-CM | POA: Diagnosis not present

## 2021-08-31 DIAGNOSIS — E782 Mixed hyperlipidemia: Secondary | ICD-10-CM | POA: Diagnosis not present

## 2021-08-31 DIAGNOSIS — M172 Bilateral post-traumatic osteoarthritis of knee: Secondary | ICD-10-CM | POA: Diagnosis not present

## 2021-08-31 DIAGNOSIS — Z7901 Long term (current) use of anticoagulants: Secondary | ICD-10-CM | POA: Diagnosis not present

## 2021-08-31 DIAGNOSIS — Z87891 Personal history of nicotine dependence: Secondary | ICD-10-CM | POA: Diagnosis not present

## 2021-08-31 DIAGNOSIS — D6862 Lupus anticoagulant syndrome: Secondary | ICD-10-CM | POA: Diagnosis not present

## 2021-08-31 DIAGNOSIS — I10 Essential (primary) hypertension: Secondary | ICD-10-CM | POA: Diagnosis not present

## 2021-08-31 DIAGNOSIS — R7303 Prediabetes: Secondary | ICD-10-CM | POA: Diagnosis not present

## 2021-09-13 DIAGNOSIS — D6862 Lupus anticoagulant syndrome: Secondary | ICD-10-CM | POA: Diagnosis not present

## 2021-09-13 DIAGNOSIS — Z7901 Long term (current) use of anticoagulants: Secondary | ICD-10-CM | POA: Diagnosis not present

## 2021-09-23 DIAGNOSIS — J042 Acute laryngotracheitis: Secondary | ICD-10-CM | POA: Diagnosis not present

## 2021-09-23 DIAGNOSIS — M76822 Posterior tibial tendinitis, left leg: Secondary | ICD-10-CM | POA: Diagnosis not present

## 2021-09-23 DIAGNOSIS — M722 Plantar fascial fibromatosis: Secondary | ICD-10-CM | POA: Diagnosis not present

## 2021-09-23 DIAGNOSIS — M25572 Pain in left ankle and joints of left foot: Secondary | ICD-10-CM | POA: Diagnosis not present

## 2021-09-23 DIAGNOSIS — M19079 Primary osteoarthritis, unspecified ankle and foot: Secondary | ICD-10-CM | POA: Diagnosis not present

## 2021-09-23 DIAGNOSIS — Z87891 Personal history of nicotine dependence: Secondary | ICD-10-CM | POA: Diagnosis not present

## 2021-10-06 ENCOUNTER — Encounter: Payer: Self-pay | Admitting: Gastroenterology
